# Patient Record
Sex: Female | Born: 1985
Health system: Southern US, Community
[De-identification: ages and names within clinical notes are randomized; demographics above are authoritative.]

## PROBLEM LIST (undated history)

## (undated) DIAGNOSIS — D259 Leiomyoma of uterus, unspecified: Secondary | ICD-10-CM

## (undated) DIAGNOSIS — Z9889 Other specified postprocedural states: Secondary | ICD-10-CM

## (undated) DIAGNOSIS — R112 Nausea with vomiting, unspecified: Secondary | ICD-10-CM

## (undated) DIAGNOSIS — N979 Female infertility, unspecified: Secondary | ICD-10-CM

## (undated) DIAGNOSIS — E282 Polycystic ovarian syndrome: Secondary | ICD-10-CM

## (undated) DIAGNOSIS — K219 Gastro-esophageal reflux disease without esophagitis: Secondary | ICD-10-CM

## (undated) DIAGNOSIS — I10 Essential (primary) hypertension: Secondary | ICD-10-CM

## (undated) DIAGNOSIS — Z8616 Personal history of COVID-19: Secondary | ICD-10-CM

## (undated) DIAGNOSIS — E890 Postprocedural hypothyroidism: Secondary | ICD-10-CM

## (undated) DIAGNOSIS — Z85850 Personal history of malignant neoplasm of thyroid: Secondary | ICD-10-CM

## (undated) HISTORY — PX: LAPAROSCOPIC GELPORT ASSISTED MYOMECTOMY: SHX6549

## (undated) HISTORY — PX: THYROID LOBECTOMY: SHX420

---

## 2005-10-26 ENCOUNTER — Other Ambulatory Visit: Admission: RE | Admit: 2005-10-26 | Discharge: 2005-10-26 | Payer: Self-pay | Admitting: Gynecology

## 2006-03-10 ENCOUNTER — Other Ambulatory Visit: Admission: RE | Admit: 2006-03-10 | Discharge: 2006-03-10 | Payer: Self-pay | Admitting: Gynecology

## 2008-05-01 ENCOUNTER — Encounter (INDEPENDENT_AMBULATORY_CARE_PROVIDER_SITE_OTHER): Payer: Self-pay | Admitting: Interventional Radiology

## 2008-05-01 ENCOUNTER — Other Ambulatory Visit: Admission: RE | Admit: 2008-05-01 | Discharge: 2008-05-01 | Payer: Self-pay | Admitting: Interventional Radiology

## 2008-05-01 ENCOUNTER — Encounter: Admission: RE | Admit: 2008-05-01 | Discharge: 2008-05-01 | Payer: Self-pay | Admitting: Otolaryngology

## 2008-06-19 ENCOUNTER — Ambulatory Visit (HOSPITAL_COMMUNITY): Admission: RE | Admit: 2008-06-19 | Discharge: 2008-06-20 | Payer: Self-pay | Admitting: Otolaryngology

## 2008-07-18 ENCOUNTER — Encounter (INDEPENDENT_AMBULATORY_CARE_PROVIDER_SITE_OTHER): Payer: Self-pay | Admitting: Otolaryngology

## 2008-07-18 ENCOUNTER — Inpatient Hospital Stay (HOSPITAL_COMMUNITY): Admission: RE | Admit: 2008-07-18 | Discharge: 2008-07-20 | Payer: Self-pay | Admitting: Otolaryngology

## 2010-01-29 IMAGING — US US BIOPSY
1 series · 14 of 14 positions shown · non-contrast
Comparison: Outside comparison ultrasound dated 04/04/2008

CLINICAL DATA: 2-year-old female palpable right thyroid mass

ULTRASOUND-GUIDED NEEDLE ASPIRATE BIOPSY, RIGHT LOBE OF THYROID
The above procedure was discussed with the patient and written
informed consent was obtained.

[Series 1: us biopsy · 0.08mm/px · 14 acquisitions, 14 frames shown]
[im 1/14]
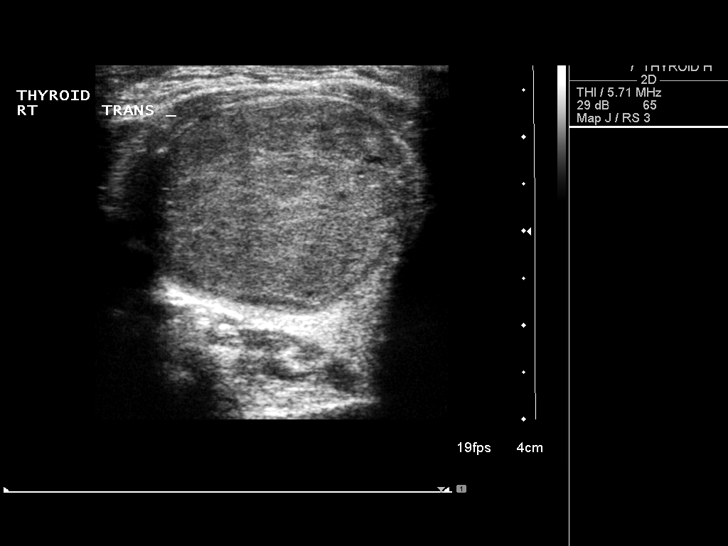
[im 2/14]
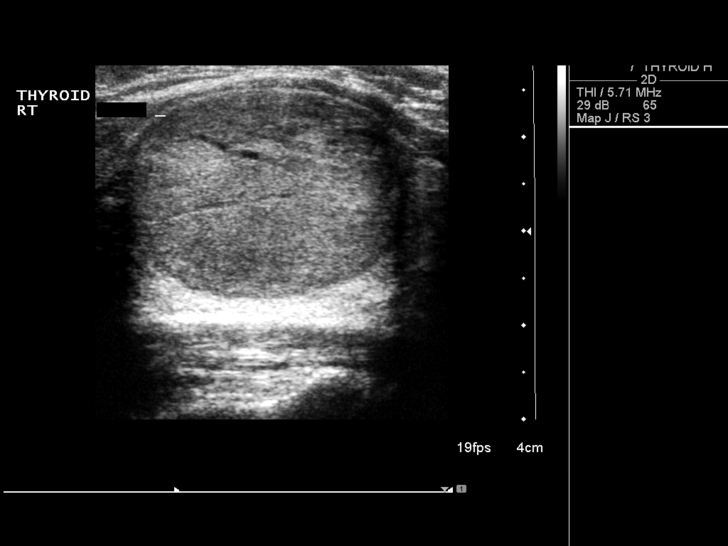
[im 3/14]
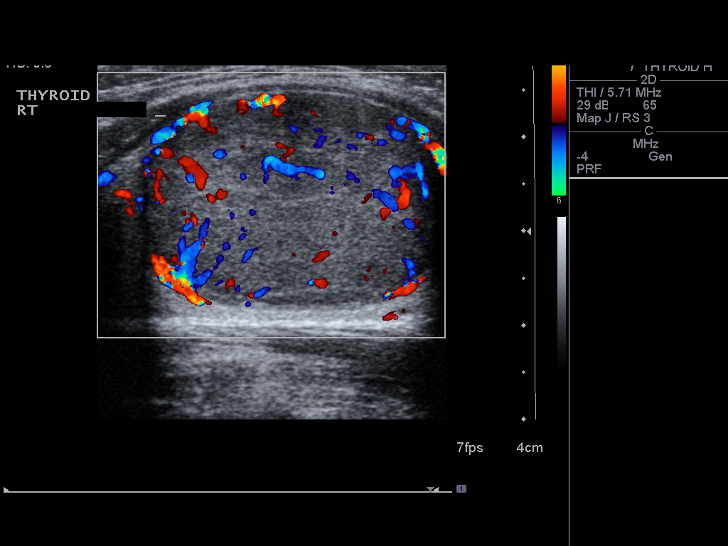
[im 4/14]
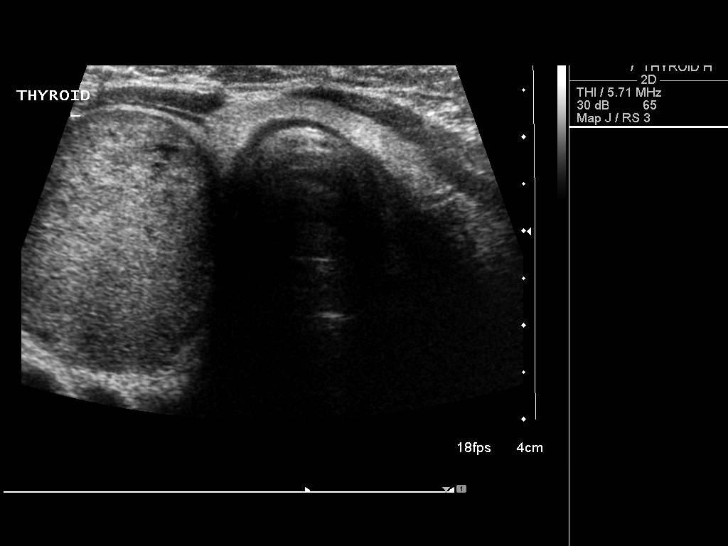
[im 5/14]
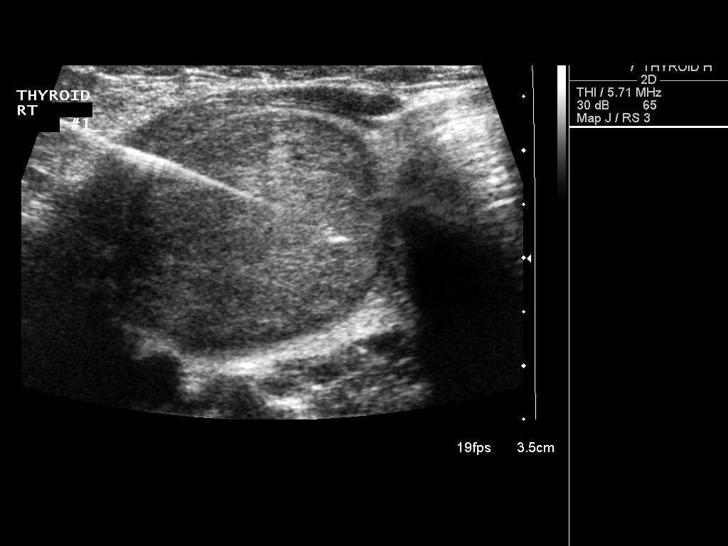
[im 6/14]
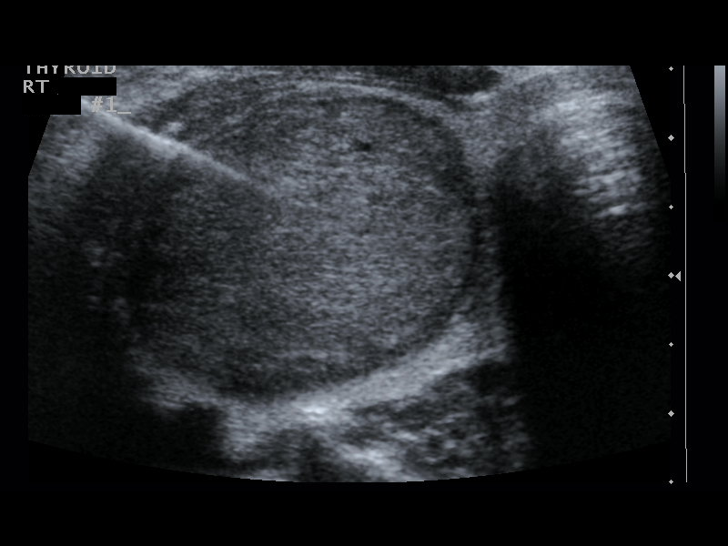
[im 7/14]
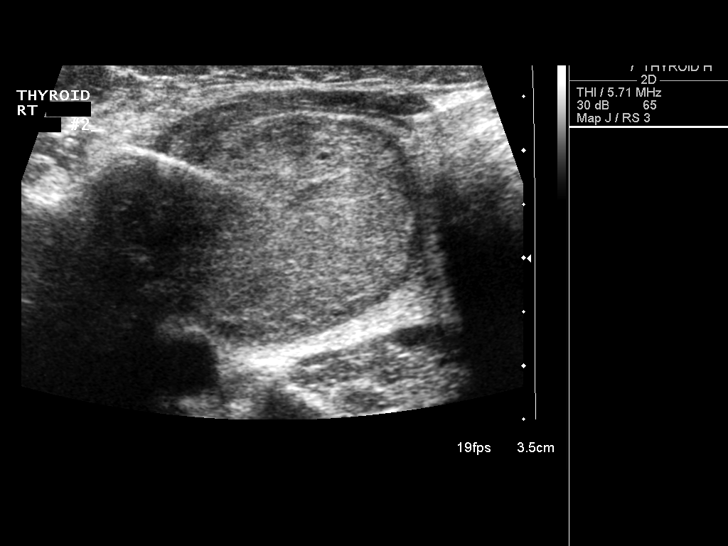
[im 8/14]
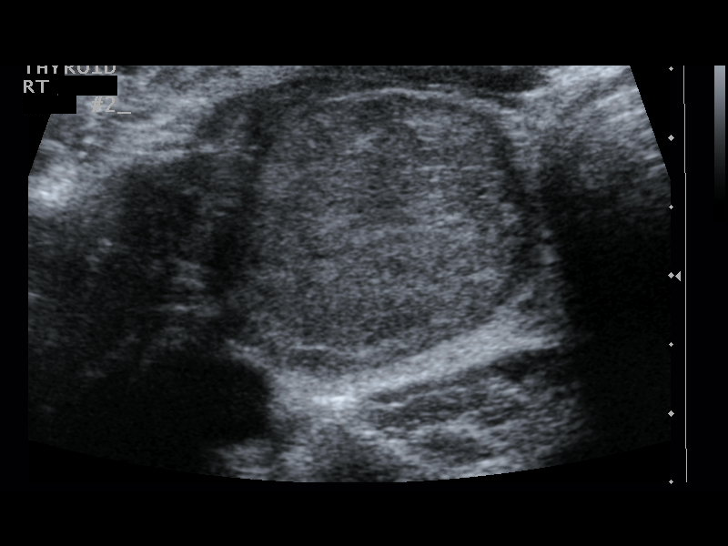
[im 9/14]
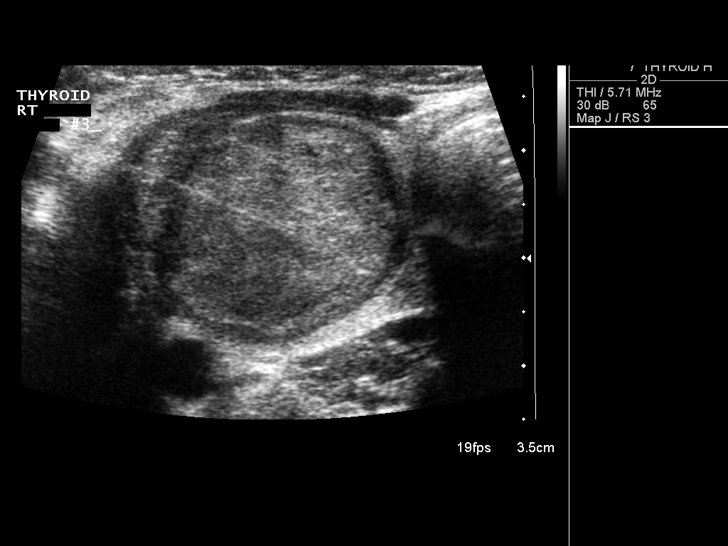
[im 10/14]
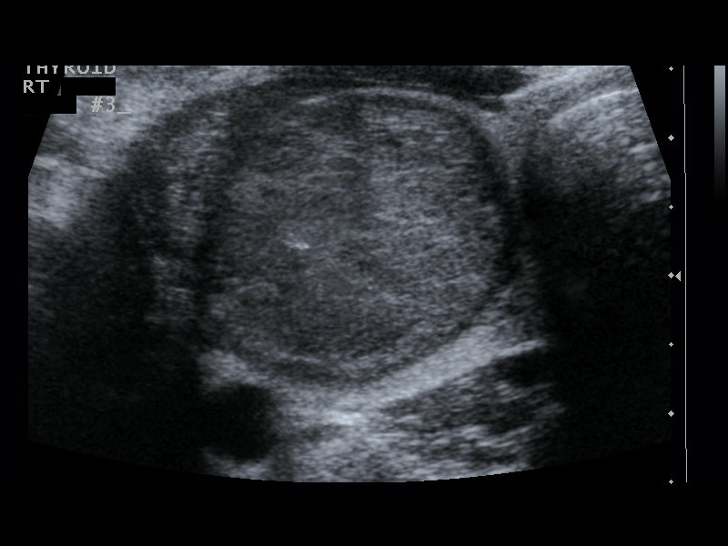
[im 11/14]
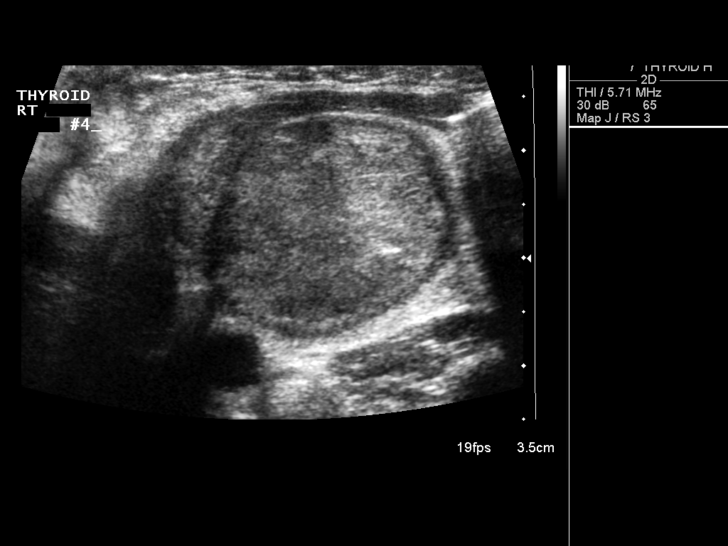
[im 12/14]
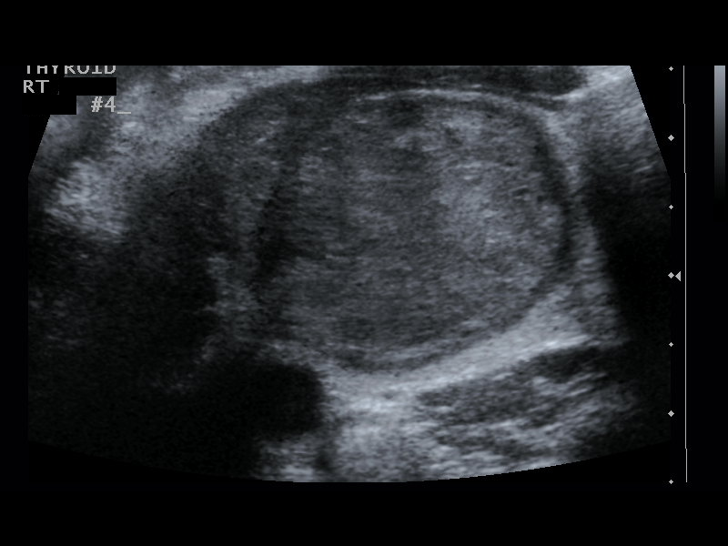
[im 13/14]
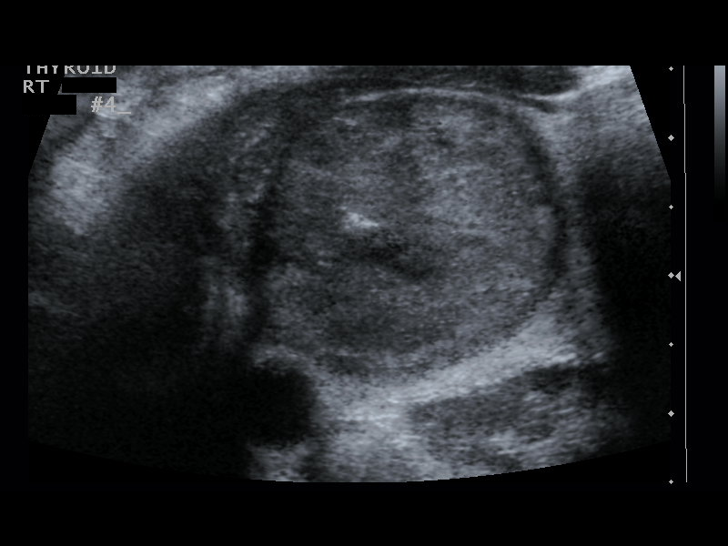
[im 14/14]
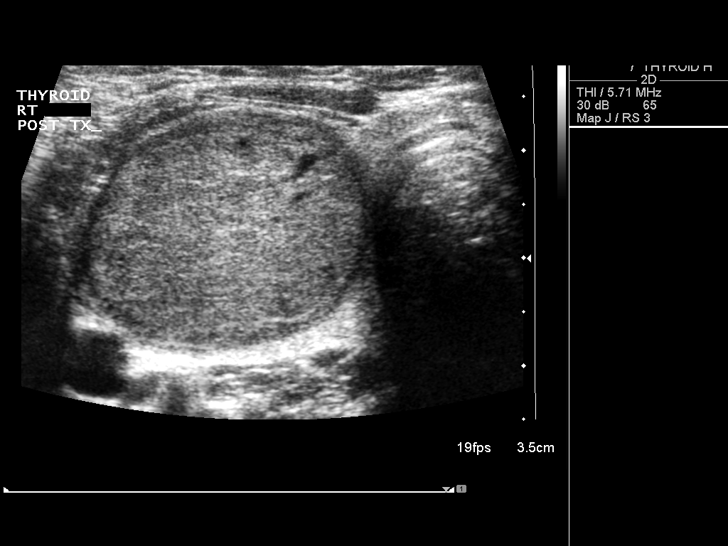

[14 of 14 positions shown; findings below may reference images not displayed]

FINDINGS: Ultrasound was performed to localize and mark an adequate
site for the biopsy.  The patient was then prepped and draped in a
normal sterile fashion.  Local anesthesia was provided with 1%
lidocaine.  Using direct ultrasound guidance, 4 passes were made
using 25 gauge needles into the nodule within the right lobe of the
thyroid.  Ultrasound was used to confirm needle placements on all
occasions.  Specimens were sent to Pathology for analysis.  Post
procedural imaging demonstrated no hematoma or immediate
complication.  The patient tolerated the procedure well.
IMPRESSION: Ultrasound guided right thyroid mass FNA biopsy (4).

## 2010-09-28 LAB — CBC
HCT: 40.1 % (ref 36.0–46.0)
Hemoglobin: 13.2 g/dL (ref 12.0–15.0)
MCHC: 32.8 g/dL (ref 30.0–36.0)
MCV: 92.9 fL (ref 78.0–100.0)
Platelets: 258 10*3/uL (ref 150–400)
RBC: 4.32 MIL/uL (ref 3.87–5.11)
RDW: 13.2 % (ref 11.5–15.5)
WBC: 5.3 10*3/uL (ref 4.0–10.5)

## 2010-09-29 LAB — CALCIUM
Calcium: 8.5 mg/dL (ref 8.4–10.5)
Calcium: 8.8 mg/dL (ref 8.4–10.5)
Calcium: 8.9 mg/dL (ref 8.4–10.5)
Calcium: 9.1 mg/dL (ref 8.4–10.5)

## 2010-09-29 LAB — CBC
HCT: 37.6 % (ref 36.0–46.0)
Hemoglobin: 13.1 g/dL (ref 12.0–15.0)
MCHC: 34.9 g/dL (ref 30.0–36.0)
MCV: 91.1 fL (ref 78.0–100.0)
Platelets: 236 10*3/uL (ref 150–400)
RBC: 4.13 MIL/uL (ref 3.87–5.11)
RDW: 12.9 % (ref 11.5–15.5)
WBC: 5.4 10*3/uL (ref 4.0–10.5)

## 2010-09-29 LAB — BASIC METABOLIC PANEL
BUN: 7 mg/dL (ref 6–23)
CO2: 28 mEq/L (ref 19–32)
Calcium: 9.4 mg/dL (ref 8.4–10.5)
Chloride: 105 mEq/L (ref 96–112)
Creatinine, Ser: 0.58 mg/dL (ref 0.4–1.2)
GFR calc Af Amer: >60 mL/min (ref >60)
GFR calc non Af Amer: >60 mL/min (ref >60)
Glucose, Bld: 94 mg/dL (ref 70–99)
Potassium: 3.9 mEq/L (ref 3.5–5.1)
Sodium: 139 mEq/L (ref 135–145)

## 2010-09-29 LAB — HCG, SERUM, QUALITATIVE: Preg, Serum: NEGATIVE

## 2010-10-27 NOTE — Op Note (Signed)
[name], [name]                ACCOUNT NO.:  [id number]   MEDICAL RECORD NO.:  [id number]          PATIENT TYPE:  OIB   LOCATION:  5121                         FACILITY:  [facility]   PHYSICIAN:  Kinnie Scales. Annalee Genta, M.D.DATE OF BIRTH:  12/26/1985   DATE OF PROCEDURE:  07/18/2008  DATE OF DISCHARGE:                               OPERATIVE REPORT   PREOPERATIVE DIAGNOSES:  1. 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  2. Status post right thyroid lobectomy (June 19, 2008).   POSTOPERATIVE DIAGNOSES:  1. 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  2. Status post right thyroid lobectomy (June 19, 2008).   INDICATIONS FOR SURGERY:  1. 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  2. Status post right thyroid lobectomy (June 19, 2008).   SURGICAL PROCEDURES:  Left completion thyroidectomy.   SURGEON:  Kinnie Scales. Annalee Genta, MD   ASSISTANT:  Karren Burly D. Jenne Pane, MD   ANESTHESIA:  General endotracheal with NIMS monitoring.   COMPLICATIONS:  None.   BLOOD LOSS:  Less than 50 mL.   The patient transferred from the operating room to recovery room in  stable condition.   BRIEF HISTORY:  The patient is a 25 year old white female who was  referred for evaluation of a right thyroid mass.  She had undergone  workup including fine-needle aspiration, which showed benign-appearing  thyroid cells.  Over time, the patient had a gradual enlargement in the  right thyroid lobe and given her history and physical examination, I  recommended thyroidectomy for removal and definitive pathologic  diagnosis.  The risks, benefits, and possible complications of the  procedure were discussed and the surgery was performed on June 19, 2008.  The final pathology from the resected tissue came back as a 3.6-  cm Hurthle cell carcinoma and given that history, I recommended that we  undertake completion thyroidectomy.  The procedure was scheduled on an  elective basis on July 18, 2008,  at [facility] Main OR.  Again, the risks, benefits, and possible complications of the procedure  were discussed in detail with the patient and her mother.  In  particular, discussion regarding long-term thyroid replacement,  subsequent therapy for thyroid carcinoma, possible hypocalcemia, and  recurrent laryngeal nerve injuries were discussed.  The patient and her  family understood concurred with our plan for surgery, which is  scheduled as above.   PROCEDURE:  The patient was brought to the operating room on July 18, 2008, and placed in supine position on the operating table.  General  endotracheal anesthesia was established without difficulty.  The  endotracheal tube was Xomed NIMS (nerve integrity monitor system tube)  for monitoring vocal cord function throughout the surgical procedure.  With the patient adequately anesthetized, she was injected with 2 mL of  1% lidocaine and 1:100,000 solution of epinephrine injected with  proposed skin incision.  She was then positioned on the operating table,  prepped and draped in a sterile fashion.  Using #15 scalpel, an incision  was created.  This carried through to the middle aspect of the patient's  previous neck incision and then extended several centimeters to the  left.  The skin and deep subcutaneous tissue were divided with a 15  scalpel and dissection was then carried out.  Subplatysmal flaps were  elevated inferiorly and superiorly.  The strap muscle curvature was  identified and divided in the midline and lateralized allowing access to  the anterior aspect of the neck.  There was a moderate amount of  scarring from the patient's previous surgical incision.  Midline was  identified.  Trachea was carefully dissected and this allowed access to  the left.  The strap muscles were elevated and the left thyroid lobe was  identified and careful dissection was carried out around the entire lobe  dissecting medially along the  thyroid capsule.  The recurrent laryngeal  nerve was identified inferiorly and traced superiorly preserving it  throughout its course.  The inferior parathyroid gland was identified  and preserved.  Middle thyroid vein was divided and suture ligated.  Dissection was then carried from inferior to superior.  Superior thyroid  pole was carefully dissected and the superior thyroid vessels were  identified, divided, and suture ligated.  The left thyroid lobe was then  carefully dissected away from the trachea.  There was moderate amount of  dense tracheal attachment, which was divided with blunt and sharp  dissection.  Again, the recurrent laryngeal nerve was preserved  throughout its course.  The left thyroid lobe was dissected and sent to  pathology for gross microscopic evaluation.  The patient's wound bed was  then thoroughly irrigated with saline solution.  A 7-French round drain  was placed in base of the wound and sutured to the skin with a 4-0  Ethilon suture.  The wound was then closed in multiple layers with  reapproximation of strap muscles in the midline with 3-0 Vicryl suture.  The platysma muscle was reapproximated with 4-0 Vicryl suture, deep  subcutaneous and superficial subcutaneous closure in interrupted fashion  with 4-0 and 5-0 Vicryl, and final skin edge closed with Dermabond.  The  patient was then awakened from anesthetic, extubated and transferred  from the operating room to the recovery room in stable condition.  No  complications.  Blood loss was approximately 50 mL.           ______________________________  Kinnie Scales. Annalee Genta, M.D.     DLS/MEDQ  D:  16/03/9603  T:  07/18/2008  Job:  [id number]

## 2010-10-27 NOTE — Op Note (Signed)
[name], [name]                ACCOUNT NO.:  [id number]   MEDICAL RECORD NO.:  [id number]          PATIENT TYPE:  OIB   LOCATION:  5123                         FACILITY:  [facility]   PHYSICIAN:  Kinnie Scales. Annalee Genta, M.D.DATE OF BIRTH:  04/09/1986   DATE OF PROCEDURE:  06/19/2008  DATE OF DISCHARGE:                               OPERATIVE REPORT   PREOPERATIVE DIAGNOSIS:  Right thyroid mass.   POSTOPERATIVE DIAGNOSIS:  Right thyroid mass.   INDICATIONS FOR SURGERY:  Right thyroid mass.   SURGICAL PROCEDURE:  Right hemithyroidectomy.   SURGEON:  Kinnie Scales. Annalee Genta, MD   ASSISTANT:  Gloris Manchester. Wolicki, MD   ANESTHESIA:  General endotracheal with nerve integrity monitoring  (NIMS).   ESTIMATED BLOOD LOSS:  Approximately 50 mL.   There were no complications.  The patient was transferred from the  operating room to recovery room in stable condition.   BRIEF HISTORY:  The patient is a 25 year old white female who was  referred for evaluation of an asymptomatic swelling in the right  inferior neck.  An ultrasound showed a nodular mass involving the right  thyroid lobe.  The remainder of the thyroid gland and neck were normal  on the ultrasound study.  Given the patient's history and examination,  ultrasound-guided fine aspiration was performed.  Pathology for the  resected tissue was consistent with a benign neoplasm.  Given the  patient's history and physical examination, I recommended that we  consider her for right hemithyroidectomy.  The risk, benefits, and  possible complications of procedure were discussed in detail.  The  patient and her family understood and concurred to the plan for surgery  which is scheduled on an elective basis at [facility] OR  on June 19, 2008.   PROCEDURE:  The patient was brought to the operating room on June 19, 2008 and placed in supine position on the operating table.  General  endotracheal anesthesia was established without  difficulty.  The  endotracheal tube was a Xomed NIMS tube for intraoperative recurrent  laryngeal nerve monitoring.  The NIMS device was checked prior to  beginning the surgical procedure and there was good signal bilaterally.  The patient was then positioned on the operating table and prepped and  draped in a sterile fashion.  She was injected with a total of 2 mL of  1% lidocaine 1:100,000 solution of epinephrine, injected in a  preexisting skin crease in the proposed skin incision in the anterior  lower aspect of the neck.  With the patient positioned, prepped and  draped, procedure was begun by creating a 4-cm horizontally oriented  incision with a #15 scalpel.  This was carried through the skin and  underlying subcutaneous tissue.  The dissection was carried through the  subcutaneous layer using Bovie electrocautery.  The anterior aspect of  the right platysma muscle was identified and divided.  Subplatysmal  flaps were elevated superiorly and inferiorly to allow access to the  anterior compartment of the neck.  The strap muscles were identified in  the midline and divided and then lateralized.  The anterior aspect of  the patient's trachea was then identified.  Dissection was carried  superiorly to expose the cricoid and thyroid cartilage.  The right  thyroid lobe was palpable with a significant enlargement compared to the  left.  Palpation of the left side showed no evidence of nodule or mass.  Dissection was then carried out along the right thyroid lobe using Bovie  electrocautery to elevate the strap muscles.  The right paratracheal  triangle was dissected using both blunt and sharp dissection.  The  common facial vein was identified, divided, and suture ligated.  The  right lobe of the gland was then reflected anteriorly, and dissection  was carried out superiorly and inferiorly.  Along the inferior pole, the  parathyroid gland was identified.  Vascular structures were ligated   using the Harmonic scalpel and inferior component of the thyroid gland  was then elevated.  The recurrent laryngeal nerve was identified and  then traced distally using the NIMS monitor throughout.  Dissection was  then carried along the superior pole of the thyroid gland.  The patient  had an approximately centimeter pyramidal lobe which emanated from the  right lobe of the thyroid gland.  This was also carefully dissected and  removed with the pathologic specimen.  Thyroid isthmus was divided.  The  superior pole of the thyroid was then carefully dissected, divided, and  suture ligated with 2-0 silk suture.  The recurrent laryngeal nerve was  traced to its insertion on the cricothyroid musculature.  The gland was  then elevated and the entire right thyroid lobe was resected and sent to  pathology for gross microscopic evaluation and frozen section analysis.  The thyroid lobe was consistent with a Hurthle cell neoplasm.  No  malignant features were identified, and final diagnosis was deferred to  final pathology.   With the right thyroid lobe dissected and removed, the recurrent  laryngeal nerve was inspected and stimulated at 0.5 millivolts with good  stimulation.  The patient's incision was then thoroughly irrigated.  A 7-  [place] round drain was placed at the depth of the right thyroid  dissection and sutured to the overlying skin with 4-0 Ethilon suture.  The incision was then closed in multiple layers beginning with  reapproximation of the strap muscles in the midline with 3-0 Vicryl  suture in an interrupted fashion.  The platysma muscle was  reapproximated with the same stitch.  Deep subcutaneous stitches were  used to approximate the next layer using 4-0 Vicryl suture.  Final skin  edge was closed with a running 6-0 Ethilon suture.  The wound was  dressed with bacitracin ointment.  The patient was awakened from  anesthetic, extubated, and was transferred to the operating room  to  recovery in stable condition.  No complications and blood loss was  approximately 50 mL.           ______________________________  Kinnie Scales. Annalee Genta, M.D.     DLS/MEDQ  D:  16/03/9603  T:  06/20/2008  Job:  [id number]

## 2019-03-15 HISTORY — PX: OVUM / OOCYTE RETRIEVAL: SUR1269

## 2019-10-04 ENCOUNTER — Encounter (HOSPITAL_COMMUNITY): Payer: Self-pay | Admitting: *Deleted

## 2019-10-04 ENCOUNTER — Other Ambulatory Visit: Payer: Self-pay

## 2019-10-04 NOTE — Progress Notes (Addendum)
ADDENDUM:  Chart reviewed by anesthesia, Konrad Felix PA, ok to proceed.   Spoke w/ via phone for pre-op interview--- PT Lab needs dos--  Istat 8, EKG, Urine preg         Lab results------ no COVID test ------ 10-06-2019 @ [id number] Arrive at ------- 1200 NPO after ------ MN w/ exception clear liquids until 0800 then nothing by mouth (no cream/ milk products) Medications to take morning of surgery ----- Labetalol, Synthroid, Oriahnn w/ sips of water Diabetic medication ----- n/a Patient Special Instructions ----- do not take metformin morning of surgery Pre-Op special Istructions ----- pre-op orders pending, sent dr Kerin Perna inbox message in epic Patient verbalized understanding of instructions that were given at this phone interview. Patient denies shortness of breath, chest pain, fever, cough a this phone interview.   Anesthesia Review: hx thyroid cancer s/p bilateral thyroidectomy and RAI 2010, no recurrance;  Hx HTN  PCP:  Maude Leriche PA Encrinologist: Dr Marcello Moores in [place] (pt stated lov 07/ 2020 televisit) Cardiologist : no Chest x-ray :  no EKG : no Stress test:  Pt stated never had one Echo : no Cardiac Cath :  no Sleep Study/ CPAP : NO Fasting Blood Sugar :      / Checks Blood Sugar -- times a day:   N/A Blood Thinner/ Instructions /Last Dose:  NO ASA / Instructions/ Last Dose :  NO

## 2019-10-06 ENCOUNTER — Other Ambulatory Visit (HOSPITAL_COMMUNITY)
Admission: RE | Admit: 2019-10-06 | Discharge: 2019-10-06 | Disposition: A | Discharge status: Home / Self Care | Payer: BC Managed Care – PPO | Source: Ambulatory Visit | Attending: Obstetrics and Gynecology | Admitting: Obstetrics and Gynecology

## 2019-10-06 DIAGNOSIS — Z20822 Contact with and (suspected) exposure to covid-19: Secondary | ICD-10-CM | POA: Diagnosis not present

## 2019-10-06 DIAGNOSIS — Z01812 Encounter for preprocedural laboratory examination: Secondary | ICD-10-CM | POA: Diagnosis present

## 2019-10-06 LAB — SARS CORONAVIRUS 2 (TAT 6-24 HRS): SARS Coronavirus 2: NEGATIVE

## 2019-10-10 ENCOUNTER — Encounter (HOSPITAL_BASED_OUTPATIENT_CLINIC_OR_DEPARTMENT_OTHER): Payer: Self-pay | Admitting: Obstetrics and Gynecology

## 2019-10-10 ENCOUNTER — Other Ambulatory Visit: Payer: Self-pay

## 2019-10-10 ENCOUNTER — Encounter (HOSPITAL_BASED_OUTPATIENT_CLINIC_OR_DEPARTMENT_OTHER)
Admission: RE | Disposition: A | Discharge status: Home / Self Care | Payer: Self-pay | Source: Home / Self Care | Attending: Obstetrics and Gynecology

## 2019-10-10 ENCOUNTER — Ambulatory Visit (HOSPITAL_BASED_OUTPATIENT_CLINIC_OR_DEPARTMENT_OTHER)
Admission: RE | Admit: 2019-10-10 | Discharge: 2019-10-10 | Disposition: A | Discharge status: Home / Self Care | Payer: BC Managed Care – PPO | Attending: Obstetrics and Gynecology | Admitting: Obstetrics and Gynecology

## 2019-10-10 ENCOUNTER — Ambulatory Visit (HOSPITAL_BASED_OUTPATIENT_CLINIC_OR_DEPARTMENT_OTHER): Payer: BC Managed Care – PPO | Admitting: Physician Assistant

## 2019-10-10 DIAGNOSIS — Z8616 Personal history of COVID-19: Secondary | ICD-10-CM | POA: Insufficient documentation

## 2019-10-10 DIAGNOSIS — Z79899 Other long term (current) drug therapy: Secondary | ICD-10-CM | POA: Diagnosis not present

## 2019-10-10 DIAGNOSIS — N978 Female infertility of other origin: Secondary | ICD-10-CM | POA: Insufficient documentation

## 2019-10-10 DIAGNOSIS — I10 Essential (primary) hypertension: Secondary | ICD-10-CM | POA: Diagnosis not present

## 2019-10-10 DIAGNOSIS — K219 Gastro-esophageal reflux disease without esophagitis: Secondary | ICD-10-CM | POA: Diagnosis not present

## 2019-10-10 DIAGNOSIS — Z85850 Personal history of malignant neoplasm of thyroid: Secondary | ICD-10-CM | POA: Insufficient documentation

## 2019-10-10 DIAGNOSIS — D250 Submucous leiomyoma of uterus: Secondary | ICD-10-CM | POA: Insufficient documentation

## 2019-10-10 DIAGNOSIS — Z7984 Long term (current) use of oral hypoglycemic drugs: Secondary | ICD-10-CM | POA: Insufficient documentation

## 2019-10-10 DIAGNOSIS — E282 Polycystic ovarian syndrome: Secondary | ICD-10-CM | POA: Diagnosis not present

## 2019-10-10 DIAGNOSIS — E890 Postprocedural hypothyroidism: Secondary | ICD-10-CM | POA: Insufficient documentation

## 2019-10-10 HISTORY — PX: HYSTEROSCOPY WITH RESECTOSCOPE: SHX5395

## 2019-10-10 HISTORY — DX: Leiomyoma of uterus, unspecified: D25.9

## 2019-10-10 HISTORY — DX: Nausea with vomiting, unspecified: R11.2

## 2019-10-10 HISTORY — DX: Personal history of COVID-19: Z86.16

## 2019-10-10 HISTORY — DX: Personal history of malignant neoplasm of thyroid: Z85.850

## 2019-10-10 HISTORY — DX: Polycystic ovarian syndrome: E28.2

## 2019-10-10 HISTORY — DX: Essential (primary) hypertension: I10

## 2019-10-10 HISTORY — DX: Postprocedural hypothyroidism: E89.0

## 2019-10-10 HISTORY — DX: Female infertility, unspecified: N97.9

## 2019-10-10 HISTORY — DX: Nausea with vomiting, unspecified: Z98.890

## 2019-10-10 HISTORY — DX: Gastro-esophageal reflux disease without esophagitis: K21.9

## 2019-10-10 LAB — POCT PREGNANCY, URINE: Preg Test, Ur: NEGATIVE

## 2019-10-10 LAB — POCT I-STAT, CHEM 8
BUN: 9 mg/dL (ref 6–20)
Calcium, Ion: 1.12 mmol/L — ABNORMAL LOW (ref 1.15–1.40)
Chloride: 104 mmol/L (ref 98–111)
Creatinine, Ser: 0.6 mg/dL (ref 0.44–1.00)
Glucose, Bld: 88 mg/dL (ref 70–99)
HCT: 34 % — ABNORMAL LOW (ref 36.0–46.0)
Hemoglobin: 11.6 g/dL — ABNORMAL LOW (ref 12.0–15.0)
Potassium: 5.4 mmol/L — ABNORMAL HIGH (ref 3.5–5.1)
Sodium: 137 mmol/L (ref 135–145)
TCO2: 30 mmol/L (ref 22–32)

## 2019-10-10 LAB — TYPE AND SCREEN
ABO/RH(D): O POS
Antibody Screen: NEGATIVE

## 2019-10-10 LAB — ABO/RH: ABO/RH(D): O POS

## 2019-10-10 SURGERY — HYSTEROSCOPY, USING RESECTOSCOPE
Anesthesia: General | Site: Vagina

## 2019-10-10 MED ORDER — PROPOFOL 10 MG/ML IV BOLUS
INTRAVENOUS | Status: DC | PRN
  Administered 2019-10-10: 200 mg via INTRAVENOUS

## 2019-10-10 MED ORDER — ACETAMINOPHEN-CODEINE #3 300-30 MG PO TABS: 1.0000 | ORAL_TABLET | ORAL | 0 refills | Status: AC | PRN

## 2019-10-10 MED ORDER — FENTANYL CITRATE (PF) 100 MCG/2ML IJ SOLN
25.0000 ug | INTRAMUSCULAR | Status: DC | PRN
  Administered 2019-10-10 (×2): 25 ug via INTRAVENOUS

## 2019-10-10 MED ORDER — KETOROLAC TROMETHAMINE 30 MG/ML IJ SOLN
INTRAMUSCULAR | Status: DC | PRN
Start: 2019-10-10 — End: 2019-10-10
  Administered 2019-10-10: 30 mg via INTRAVENOUS

## 2019-10-10 MED ORDER — VASOPRESSIN 20 UNIT/ML IV SOLN: INTRAVENOUS | Status: DC | PRN

## 2019-10-10 MED ORDER — DEXAMETHASONE SODIUM PHOSPHATE 10 MG/ML IJ SOLN
INTRAMUSCULAR | Status: AC
  Filled 2019-10-10: qty 1

## 2019-10-10 MED ORDER — FENTANYL CITRATE (PF) 100 MCG/2ML IJ SOLN
INTRAMUSCULAR | Status: AC
  Filled 2019-10-10: qty 2

## 2019-10-10 MED ORDER — MIDAZOLAM HCL 2 MG/2ML IJ SOLN
INTRAMUSCULAR | Status: AC
  Filled 2019-10-10: qty 2

## 2019-10-10 MED ORDER — ONDANSETRON HCL 4 MG/2ML IJ SOLN
INTRAMUSCULAR | Status: AC
  Filled 2019-10-10: qty 2

## 2019-10-10 MED ORDER — PROMETHAZINE HCL 25 MG/ML IJ SOLN
6.2500 mg | Freq: Once | INTRAMUSCULAR | Status: AC
  Administered 2019-10-10: 6.25 mg via INTRAVENOUS

## 2019-10-10 MED ORDER — SODIUM CHLORIDE 0.9 % IR SOLN
Status: DC | PRN
  Administered 2019-10-10: 6000 mL

## 2019-10-10 MED ORDER — ESTRADIOL 2 MG PO TABS: 3.0000 mg | ORAL_TABLET | Freq: Two times a day (BID) | ORAL | 0 refills | Status: DC

## 2019-10-10 MED ORDER — ONDANSETRON HCL 4 MG/2ML IJ SOLN
INTRAMUSCULAR | Status: DC | PRN
  Administered 2019-10-10: 4 mg via INTRAVENOUS

## 2019-10-10 MED ORDER — DEXAMETHASONE SODIUM PHOSPHATE 10 MG/ML IJ SOLN
INTRAMUSCULAR | Status: DC | PRN
  Administered 2019-10-10: 10 mg via INTRAVENOUS

## 2019-10-10 MED ORDER — VASOPRESSIN 20 UNIT/ML IV SOLN
INTRAVENOUS | Status: DC | PRN
  Administered 2019-10-10: 7 mL via INTRAMUSCULAR

## 2019-10-10 MED ORDER — CEFAZOLIN SODIUM-DEXTROSE 2-4 GM/100ML-% IV SOLN
2.0000 g | INTRAVENOUS | Status: AC
  Administered 2019-10-10: 14:00:00 2 g via INTRAVENOUS

## 2019-10-10 MED ORDER — FENTANYL CITRATE (PF) 100 MCG/2ML IJ SOLN
INTRAMUSCULAR | Status: DC | PRN
  Administered 2019-10-10 (×3): 25 ug via INTRAVENOUS
  Administered 2019-10-10 (×2): 50 ug via INTRAVENOUS
  Administered 2019-10-10: 25 ug via INTRAVENOUS

## 2019-10-10 MED ORDER — CEFAZOLIN SODIUM-DEXTROSE 2-4 GM/100ML-% IV SOLN
INTRAVENOUS | Status: AC
  Filled 2019-10-10: qty 100

## 2019-10-10 MED ORDER — PROMETHAZINE HCL 25 MG/ML IJ SOLN
INTRAMUSCULAR | Status: AC
  Filled 2019-10-10: qty 1

## 2019-10-10 MED ORDER — LACTATED RINGERS IV SOLN: INTRAVENOUS | Status: DC

## 2019-10-10 MED ORDER — MIDAZOLAM HCL 2 MG/2ML IJ SOLN
INTRAMUSCULAR | Status: DC | PRN
  Administered 2019-10-10: 2 mg via INTRAVENOUS

## 2019-10-10 MED ORDER — LIDOCAINE 2% (20 MG/ML) 5 ML SYRINGE
INTRAMUSCULAR | Status: DC | PRN
  Administered 2019-10-10: 100 mg via INTRAVENOUS

## 2019-10-10 MED ORDER — PROPOFOL 10 MG/ML IV BOLUS
INTRAVENOUS | Status: AC
  Filled 2019-10-10: qty 20

## 2019-10-10 MED ORDER — MEDROXYPROGESTERONE ACETATE 10 MG PO TABS
ORAL_TABLET | ORAL | 0 refills | Status: DC
Start: 2019-10-10 — End: 2020-08-05

## 2019-10-10 SURGICAL SUPPLY — 19 items
BIPOLAR CUTTING LOOP 21FR (ELECTRODE) ×1
BRR ADH 6X5 SEPRAFILM 1 SHT (MISCELLANEOUS) ×1
CATH ROBINSON RED A/P 16FR (CATHETERS) ×2 IMPLANT
COVER WAND RF STERILE (DRAPES) ×2 IMPLANT
ELECT BIPOLAR KNIFE NDL PTD 7M (ELECTRODE) IMPLANT
GAUZE 4X4 16PLY RFD (DISPOSABLE) ×2 IMPLANT
GLOVE BIO SURGEON STRL SZ 6.5 (GLOVE) ×1 IMPLANT
GLOVE BIO SURGEON STRL SZ8 (GLOVE) ×2 IMPLANT
GLOVE BIOGEL PI IND STRL 7.0 (GLOVE) IMPLANT
GLOVE BIOGEL PI INDICATOR 7.0 (GLOVE) ×2
GOWN STRL REUS W/TWL XL LVL3 (GOWN DISPOSABLE) ×4 IMPLANT
IV NS IRRIG 3000ML ARTHROMATIC (IV SOLUTION) ×3 IMPLANT
KIT PROCEDURE FLUENT (KITS) ×2 IMPLANT
LOOP CUTTING BIPOLAR 21FR (ELECTRODE) IMPLANT
PACK VAGINAL MINOR WOMEN LF (CUSTOM PROCEDURE TRAY) ×2 IMPLANT
PAD OB MATERNITY 4.3X12.25 (PERSONAL CARE ITEMS) ×2 IMPLANT
SEPRAFILM MEMBRANE 5X6 (MISCELLANEOUS) ×1 IMPLANT
SYRINGE 3CC/18X1.5 ECLIPSE (MISCELLANEOUS) ×2 IMPLANT
TOWEL OR 17X26 10 PK STRL BLUE (TOWEL DISPOSABLE) ×3 IMPLANT

## 2019-10-10 NOTE — Discharge Instructions (Signed)
Hysteroscopy, Care After This sheet gives you information about how to care for yourself after your procedure. Your health care provider may also give you more specific instructions. If you have problems or questions, contact your health care provider. What can I expect after the procedure? After the procedure, it is common to have:  Cramping.  Bleeding. This can vary from light spotting to menstrual-like bleeding. Follow these instructions at home: Activity  Rest for 1-2 days after the procedure.  Do not douche, use tampons, or have sex for 2 weeks after the procedure, or until your health care provider approves.  Do not drive for 24 hours after the procedure, or for as long as told by your health care provider.  Do not drive, use heavy machinery, or drink alcohol while taking prescription pain medicines. Medicines   Take over-the-counter and prescription medicines only as told by your health care provider.  Do not take aspirin during recovery. It can increase the risk of bleeding. General instructions  Do not take baths, swim, or use a hot tub until your health care provider approves. Take showers instead of baths for 2 weeks, or for as long as told by your health care provider.  To prevent or treat constipation while you are taking prescription pain medicine, your health care provider may recommend that you: ? Drink enough fluid to keep your urine clear or pale yellow. ? Take over-the-counter or prescription medicines. ? Eat foods that are high in fiber, such as fresh fruits and vegetables, whole grains, and beans. ? Limit foods that are high in fat and processed sugars, such as fried and sweet foods.  Keep all follow-up visits as told by your health care provider. This is important. Contact a health care provider if:  You feel dizzy or lightheaded.  You feel nauseous.  You have abnormal vaginal discharge.  You have a rash.  You have pain that does not get better with  medicine.  You have chills. Get help right away if:  You have bleeding that is heavier than a normal menstrual period.  You have a fever.  You have pain or cramps that get worse.  You develop new abdominal pain.  You faint.  You have pain in your shoulders.  You have shortness of breath. Summary  After the procedure, you may have cramping and some vaginal bleeding.  Do not douche, use tampons, or have sex for 2 weeks after the procedure, or until your health care provider approves.  Do not take baths, swim, or use a hot tub until your health care provider approves. Take showers instead of baths for 2 weeks, or for as long as told by your health care provider.  Report any unusual symptoms to your health care provider.  Keep all follow-up visits as told by your health care provider. This is important. This information is not intended to replace advice given to you by your health care provider. Make sure you discuss any questions you have with your health care provider. Document Revised: 05/13/2017 Document Reviewed: 06/29/2016 Elsevier Patient Education  [place] Instructions  Activity: Get plenty of rest for the remainder of the day. A responsible individual must stay with you for 24 hours following the procedure.  For the next 24 hours, DO NOT: -Drive a car -Paediatric nurse -Drink alcoholic beverages -Take any medication unless instructed by your physician -Make any legal decisions or sign important papers.  Meals: Start with liquid foods  such as gelatin or soup. Progress to regular foods as tolerated. Avoid greasy, spicy, heavy foods. If nausea and/or vomiting occur, drink only clear liquids until the nausea and/or vomiting subsides. Call your physician if vomiting continues.  Special Instructions/Symptoms: Your throat may feel dry or sore from the anesthesia or the breathing tube placed in your throat during surgery. If this  causes discomfort, gargle with warm salt water. The discomfort should disappear within 24 hours.  No ibuprofen, Motrin, Aleve, Advil, or naproxen until after 11:30pm tonight if needed.

## 2019-10-10 NOTE — Transfer of Care (Signed)
Immediate Anesthesia Transfer of Care Note  Patient: [name]  Procedure(s) Performed: Procedure(s) (LRB): HYSTEROSCOPY, BIPOLAR RESECTION of SUBMUCOSAL MYOMAS (N/A)  Patient Location: PACU  Anesthesia Type: General  Level of Consciousness: awake, oriented, sedated and patient cooperative  Airway & Oxygen Therapy: Patient Spontanous Breathing and Patient connected to face mask oxygen  Post-op Assessment: Report given to PACU RN and Post -op Vital signs reviewed and stable  Post vital signs: Reviewed and stable  Complications: No apparent anesthesia complications Last Vitals:  Vitals Value Taken Time  BP 161/96 10/10/19 1537  Temp 36.9 C 10/10/19 1537  Pulse 82 10/10/19 1540  Resp 12 10/10/19 1540  SpO2 100 % 10/10/19 1540  Vitals shown include unvalidated device data.  Last Pain:  Vitals:   10/10/19 1231  TempSrc: Oral  PainSc: 0-No pain      Patients Stated Pain Goal: 6 ([id number])  Complications:

## 2019-10-10 NOTE — Anesthesia Procedure Notes (Signed)
Procedure Name: LMA Insertion Date/Time: 10/10/2019 2:28 PM Performed by: Suan Halter, CRNA Pre-anesthesia Checklist: Patient identified, Emergency Drugs available, Suction available and Patient being monitored Patient Re-evaluated:Patient Re-evaluated prior to induction Oxygen Delivery Method: Circle system utilized Preoxygenation: Pre-oxygenation with 100% oxygen Induction Type: IV induction Ventilation: Mask ventilation without difficulty LMA: LMA inserted LMA Size: 4.0 Number of attempts: 1 Airway Equipment and Method: Bite block Placement Confirmation: positive ETCO2 Tube secured with: Tape Dental Injury: Teeth and Oropharynx as per pre-operative assessment

## 2019-10-10 NOTE — Anesthesia Postprocedure Evaluation (Signed)
Anesthesia Post Note  Patient: [name], molecular  Procedure(s) Performed: HYSTEROSCOPY, BIPOLAR RESECTION of SUBMUCOSAL MYOMAS (N/A Vagina )     Patient location during evaluation: PACU Anesthesia Type: General Level of consciousness: awake Pain management: pain level controlled Vital Signs Assessment: post-procedure vital signs reviewed and stable Respiratory status: spontaneous breathing Cardiovascular status: stable Postop Assessment: no apparent nausea or vomiting Anesthetic complications: no    Last Vitals:  Vitals:   10/10/19 1600 10/10/19 1615  BP: (!) 152/89 (!) 144/92  Pulse: 71 76  Resp: 14 12  Temp:    SpO2: 98% 96%    Last Pain:  Vitals:   10/10/19 1549  TempSrc:   PainSc: 7                  Beren Yniguez

## 2019-10-10 NOTE — Anesthesia Preprocedure Evaluation (Addendum)
Anesthesia Evaluation  Patient identified by MRN, date of birth, ID band Patient awake    Reviewed: Allergy & Precautions, NPO status , Patient's Chart, lab work & pertinent test results  History of Anesthesia Complications (+) PONV  Airway Mallampati: II  TM Distance: >3 FB     Dental   Pulmonary    breath sounds clear to auscultation       Cardiovascular hypertension,  Rhythm:Regular Rate:Normal     Neuro/Psych    GI/Hepatic Neg liver ROS, GERD  ,  Endo/Other  Hypothyroidism   Renal/GU negative Renal ROS     Musculoskeletal   Abdominal   Peds  Hematology   Anesthesia Other Findings   Reproductive/Obstetrics                             Anesthesia Physical Anesthesia Plan  ASA: III  Anesthesia Plan: General   Post-op Pain Management:    Induction: Intravenous  PONV Risk Score and Plan: 4 or greater and Ondansetron and Midazolam  Airway Management Planned: Oral ETT  Additional Equipment:   Intra-op Plan:   Post-operative Plan: Extubation in OR  Informed Consent: I have reviewed the patients History and Physical, chart, labs and discussed the procedure including the risks, benefits and alternatives for the proposed anesthesia with the patient or authorized representative who has indicated his/her understanding and acceptance.     Dental advisory given  Plan Discussed with: CRNA and Anesthesiologist  Anesthesia Plan Comments:         Anesthesia Quick Evaluation

## 2019-10-10 NOTE — Op Note (Signed)
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 The uterus sounded to 8 cm. Endometrial cavity had right type II submucosal myoma measuring 1.8 cm in the lower half of the uterine cavity as well as a left anterior 1.6 cm type I myoma in the lower uterine segment. Otherwise it was of normal appearance and normal configuration.  The fundus was slightly arcuate configuration. Both tubal ostia were seen.  Description of procedure: Patient was placed in dorsal supine position. General anesthesia was administered. She was placed in lithotomy position. She was prepped and draped in sterile manner. A vaginal speculum was placed. A dilute vasopressin solution containing 0.33 units per milliliter was injected into the cervical stroma x 5 cc. A Slimline hysteroscope with 12 lens was inserted into the canal and above findings were noted. Distention medium was normal saline. Distention method was a hysteroscopic pump set at 100 mm mercury (patient's MAP).  Above findings were noted. We then switched to a Slimline bipolar resectoscope with a 21 French loop electrode attached and resected the myomas carefully.  Intermittently the the uterine cavity was decompressed, allowing the intramural portion of the myomas to extrude into the cavity and the hysteroscopic resection was then resumed. Normal anatomy was achieved.  As an adhesion barrier, we rolled the large sheet of Seprafilm around DeBakey forceps and  inserted it until it touched the fundus and trimmed off.  Hemostasis was insured. Instrument count was correct. Estimated blood loss was less than 20 mL. The patient tolerated the procedure well and was transferred to recovery in satisfactory condition. Patient will take Estrace 3 mg twice daily for 30 days to expedite endometrial healing.  We will add Provera 10 mg to the last 5 days of this.  We will perform the frozen embryo transfer after confirming normal endometrial cavity healing.  Governor Specking, MD

## 2019-10-10 NOTE — H&P (Signed)
[name] is a 34 y.o. female , originally referred to me by Dr. Gunnar Bulla for infertility and myomectomy.  She was diagnosed with fibroids because of abnormal uterine bleeding and underwent a laparoscopy and GelPort myomectomy.  In vitro fertilization and frozen embryo transfer was performed without success.  At that time we noticed 2 new small submucosal myomas and a type II-type III myoma abutting the endometrium.  Transvaginal ultrasound showed right transmural myoma measuring 2.9x2.6cm, right type II submucosal myoma measuring 1.8cm, left anterior type two myoma measuring 1.9x1.3cm slightly distorting cavity. Patient would like to preserve her childbearing potential as well as conceive from her remaining cryopreserved embryos.  Pertinent Gynecological History: Menses: flow is excessive with use of 3 pads or tampons on heaviest days Bleeding: dysfunctional uterine bleeding Contraception: none DES exposure: denies Blood transfusions: none Sexually transmitted diseases: no past history Previous GYN Procedures: Laparoscopy GelPort assisted myomectomy Last pap: normal    Menstrual History: Menarche age: 49 No LMP recorded.    Past Medical History:  Diagnosis Date  . GERD (gastroesophageal reflux disease)    occasional , no meds   . History of 2019 novel coronavirus disease (COVID-19)    tested positive 04-12-2019 (results in care everywhere);  (10-04-2019 per pt symptoms where aches, head cold, fatigue, loss taste/ smell;  states symptoms resolved in 2 weeks)  . History of thyroid cancer (10-04-2019 per pt lov 07/ 2020, no recurrence   endocrinologist--- Dr Marcello Moores in [place]  (dx 01/ 2010)  s/p  thyroidectomy left 01/ 2010 and right 02/ 2010;  post RAI 03/ 2010    . Hypertension    followed by pcp   (10-04-2019 per pt never had a stress test)  . Hypothyroidism, postsurgical endocrinologist--- Dr Marcello Moores in [place]   s/p total thyroidectomy 2010 for cancer  . Infertility,  female   . PCOS (polycystic ovarian syndrome)   . PONV (postoperative nausea and vomiting)   . Uterine fibroid                     Past Surgical History:  Procedure Laterality Date  . LAPAROSCOPIC GELPORT ASSISTED MYOMECTOMY  05-01-2019   @[facility]   dr Marijo Conception ADHESIONS/ MYOMECTOMY/ EXCISION AND ABLATION ENDOMETRIOSIS/ CHROMOTUBATION  . OVUM / OOCYTE RETRIEVAL  03/2019  . THYROID LOBECTOMY Bilateral left 01/ 2010;  right 02/ 2010             History reviewed. No pertinent family history. No hereditary disease.  No cancer of breast, ovary, uterus. No cutaneous leiomyomatosis or renal cell carcinoma.  Social History   Socioeconomic History  . Marital status: Single    Spouse name: Not on file  . Number of children: Not on file  . Years of education: Not on file  . Highest education level: Not on file  Occupational History  . Not on file  Tobacco Use  . Smoking status: Never Smoker  . Smokeless tobacco: Never Used  Substance and Sexual Activity  . Alcohol use: Yes    Comment: occasional  . Drug use: Never  . Sexual activity: Not on file  Other Topics Concern  . Not on file  Social History Narrative  . Not on file   Social Determinants of Health   Financial Resource Strain:   . Difficulty of Paying Living Expenses:   Food Insecurity:   . Worried About Charity fundraiser in the Last Year:   . [place] in the Last  Year:   Transportation Needs:   . Film/video editor (Medical):   Marland Kitchen Lack of Transportation (Non-Medical):   Physical Activity:   . Days of Exercise per Week:   . Minutes of Exercise per Session:   Stress:   . Feeling of Stress :   Social Connections:   . Frequency of Communication with Friends and Family:   . Frequency of Social Gatherings with Friends and Family:   . Attends Religious Services:   . Active Member of Clubs or Organizations:   . Attends Archivist Meetings:   Marland Kitchen Marital Status:   Intimate Partner  Violence:   . Fear of Current or Ex-Partner:   . Emotionally Abused:   Marland Kitchen Physically Abused:   . Sexually Abused:     No Known Allergies  No current facility-administered medications on file prior to encounter.   Current Outpatient Medications on File Prior to Encounter  Medication Sig Dispense Refill  . Elagolix-Estrad-Noreth & Elago (ORIAHNN) 300-1-0.5 & 300 MG CPPK Take by mouth.    . labetalol (NORMODYNE) 100 MG tablet Take 100 mg by mouth 2 (two) times daily.    Marland Kitchen levothyroxine (SYNTHROID) 150 MCG tablet Take 150 mcg by mouth as directed. One tab daily in am with exception on Sunday's take half tab    . metFORMIN (GLUCOPHAGE) 1000 MG tablet Take 1,000 mg by mouth 2 (two) times daily with a meal.    . Prenatal Vit-Fe Fumarate-FA (MULTIVITAMIN-PRENATAL) 27-0.8 MG TABS tablet Take 1 tablet by mouth daily at 12 noon.       Review of Systems  Constitutional: Negative.   HENT: Negative.   Eyes: Negative.   Respiratory: Negative.   Cardiovascular: Negative.   Gastrointestinal: Negative.   Genitourinary: Negative.   Musculoskeletal: Negative.   Skin: Negative.   Neurological: Negative.   Endo/Heme/Allergies: Negative.   Psychiatric/Behavioral: Negative.      Physical Exam  BP (!) 165/81   Pulse 85   Temp 98.3 F (36.8 C) (Oral)   Resp 17   Ht 5\' 6"  (1.676 m)   Wt 79.8 kg   LMP 10/01/2019 (Exact Date)   SpO2 100%   BMI 28.39 kg/m  Constitutional: She is oriented to person, place, and time. She appears well-developed and well-nourished.  HENT:  Head: Normocephalic and atraumatic.  Nose: Nose normal.  Mouth/Throat: Oropharynx is clear and moist. No oropharyngeal exudate.  Eyes: Conjunctivae normal and EOM are normal. Pupils are equal, round, and reactive to light. No scleral icterus.  Neck: Normal range of motion. Neck supple. No tracheal deviation present. No thyromegaly present.  Cardiovascular: Normal rate.   Respiratory: Effort normal and breath sounds normal.   GI: Soft. Bowel sounds are normal. She exhibits no distension and no mass. There is no tenderness.  Lymphadenopathy:    She has no cervical adenopathy.  Neurological: She is alert and oriented to person, place, and time. She has normal reflexes.  Skin: Skin is warm.  Psychiatric: She has a normal mood and affect. Her behavior is normal. Judgment and thought content normal.   Assessment/Plan:  Recurrent submucosal and type II-type III myomas, status post laparoscopic GelPort assisted myomectomy Infertility, with failure of a single embryo transfer after in vitro fertilization Benefits and risks of the proposed procedure were discussed with the patient and her family member again.  Bowel prep instructions were given.  All of patient's questions were answered.  She verbalized understanding.  She knows that it is recommended she does not conceive  for 2-3 months for uterus to heal.   Governor Specking, MD

## 2019-10-11 LAB — SURGICAL PATHOLOGY

## 2020-02-11 LAB — OB RESULTS CONSOLE RPR: RPR: NONREACTIVE

## 2020-02-11 LAB — OB RESULTS CONSOLE ABO/RH: RH Type: POSITIVE

## 2020-02-11 LAB — OB RESULTS CONSOLE GC/CHLAMYDIA
Chlamydia: NEGATIVE
Gonorrhea: NEGATIVE

## 2020-02-11 LAB — OB RESULTS CONSOLE HIV ANTIBODY (ROUTINE TESTING): HIV: NONREACTIVE

## 2020-02-11 LAB — OB RESULTS CONSOLE ANTIBODY SCREEN: Antibody Screen: NEGATIVE

## 2020-02-11 LAB — OB RESULTS CONSOLE RUBELLA ANTIBODY, IGM: Rubella: IMMUNE

## 2020-02-11 LAB — OB RESULTS CONSOLE HEPATITIS B SURFACE ANTIGEN: Hepatitis B Surface Ag: NEGATIVE

## 2020-07-22 ENCOUNTER — Encounter (HOSPITAL_COMMUNITY): Payer: Self-pay

## 2020-07-22 ENCOUNTER — Encounter (HOSPITAL_COMMUNITY): Payer: Self-pay | Admitting: *Deleted

## 2020-07-22 NOTE — Patient Instructions (Signed)
[name]  07/22/2020   Your procedure is scheduled on:  07/31/2020  Arrive at [facility] [place] on [address] at [address]  and [address]. You are invited to use the FREE valet parking or use the Visitor's parking deck.  Pick up the phone at the desk and dial [phone].  Call this number if you have problems the morning of surgery: [phone]  Remember:   Do not eat food:(After Midnight) Desps de medianoche.  Do not drink clear liquids: (After Midnight) Desps de medianoche.  Take these medicines the morning of surgery with A SIP OF WATER:  Take synthroid and labetalol as prescribed   Do not wear jewelry, make-up or nail polish.  Do not wear lotions, powders, or perfumes. Do not wear deodorant.  Do not shave 48 hours prior to surgery.  Do not bring valuables to the hospital.  [facility] is not   responsible for any belongings or valuables brought to the hospital.  Contacts, dentures or bridgework may not be worn into surgery.  Leave suitcase in the car. After surgery it may be brought to your room.  For patients admitted to the hospital, checkout time is 11:00 AM the day of              discharge.      Please read over the following fact sheets that you were given:     Preparing for Surgery

## 2020-07-29 ENCOUNTER — Encounter (HOSPITAL_COMMUNITY)
Admission: RE | Admit: 2020-07-29 | Discharge: 2020-07-29 | Disposition: A | Discharge status: Home / Self Care | Payer: Managed Care, Other (non HMO) | Source: Ambulatory Visit | Attending: Obstetrics and Gynecology | Admitting: Obstetrics and Gynecology

## 2020-07-29 ENCOUNTER — Other Ambulatory Visit: Payer: Self-pay

## 2020-07-29 ENCOUNTER — Other Ambulatory Visit (HOSPITAL_COMMUNITY)
Admission: RE | Admit: 2020-07-29 | Discharge: 2020-07-29 | Disposition: A | Discharge status: Home / Self Care | Payer: Managed Care, Other (non HMO) | Source: Ambulatory Visit | Attending: Obstetrics and Gynecology | Admitting: Obstetrics and Gynecology

## 2020-07-29 DIAGNOSIS — Z20822 Contact with and (suspected) exposure to covid-19: Secondary | ICD-10-CM | POA: Insufficient documentation

## 2020-07-29 DIAGNOSIS — Z01812 Encounter for preprocedural laboratory examination: Secondary | ICD-10-CM | POA: Insufficient documentation

## 2020-07-29 LAB — COMPREHENSIVE METABOLIC PANEL
ALT: 20 U/L (ref 0–44)
AST: 25 U/L (ref 15–41)
Albumin: 2.7 g/dL — ABNORMAL LOW (ref 3.5–5.0)
Alkaline Phosphatase: 197 U/L — ABNORMAL HIGH (ref 38–126)
Anion gap: 10 (ref 5–15)
BUN: 7 mg/dL (ref 6–20)
CO2: 22 mmol/L (ref 22–32)
Calcium: 9.4 mg/dL (ref 8.9–10.3)
Chloride: 105 mmol/L (ref 98–111)
Creatinine, Ser: 0.71 mg/dL (ref 0.44–1.00)
GFR, Estimated: >60 mL/min (ref >60)
Glucose, Bld: 91 mg/dL (ref 70–99)
Potassium: 4.2 mmol/L (ref 3.5–5.1)
Sodium: 137 mmol/L (ref 135–145)
Total Bilirubin: 0.4 mg/dL (ref 0.3–1.2)
Total Protein: 5.8 g/dL — ABNORMAL LOW (ref 6.5–8.1)

## 2020-07-29 LAB — TYPE AND SCREEN
ABO/RH(D): O POS
Antibody Screen: NEGATIVE

## 2020-07-29 LAB — CBC
HCT: 38.6 % (ref 36.0–46.0)
Hemoglobin: 12.7 g/dL (ref 12.0–15.0)
MCH: 31.7 pg (ref 26.0–34.0)
MCHC: 32.9 g/dL (ref 30.0–36.0)
MCV: 96.3 fL (ref 80.0–100.0)
Platelets: 244 10*3/uL (ref 150–400)
RBC: 4.01 MIL/uL (ref 3.87–5.11)
RDW: 13.8 % (ref 11.5–15.5)
WBC: 7.6 10*3/uL (ref 4.0–10.5)
nRBC: 0 % (ref 0.0–0.2)

## 2020-07-29 LAB — SARS CORONAVIRUS 2 (TAT 6-24 HRS): SARS Coronavirus 2: NEGATIVE

## 2020-07-30 LAB — RPR: RPR Ser Ql: NONREACTIVE

## 2020-07-30 NOTE — Anesthesia Preprocedure Evaluation (Addendum)
Anesthesia Evaluation  Patient identified by MRN, date of birth, ID band Patient awake    Reviewed: Allergy & Precautions, NPO status , Patient's Chart, lab work & pertinent test results  History of Anesthesia Complications (+) PONV and history of anesthetic complications  Airway Mallampati: II  TM Distance: >3 FB Neck ROM: Full    Dental no notable dental hx.    Pulmonary neg pulmonary ROS,    Pulmonary exam normal breath sounds clear to auscultation       Cardiovascular hypertension, Pt. on medications and Pt. on home beta blockers Normal cardiovascular exam Rhythm:Regular Rate:Normal  ECG: NSR, rate 77   Neuro/Psych negative neurological ROS  negative psych ROS   GI/Hepatic negative GI ROS, Neg liver ROS,   Endo/Other  Hypothyroidism PCOS (polycystic ovarian syndrome)  Renal/GU negative Renal ROS     Musculoskeletal negative musculoskeletal ROS (+)   Abdominal (+) + obese,   Peds  Hematology negative hematology ROS (+)   Anesthesia Other Findings 37wks Twins, history of myomectomy  Reproductive/Obstetrics (+) Pregnancy                            Anesthesia Physical Anesthesia Plan  ASA: II  Anesthesia Plan: Spinal   Post-op Pain Management:    Induction:   PONV Risk Score and Plan: 3 and Ondansetron, Dexamethasone and Treatment may vary due to age or medical condition  Airway Management Planned: Natural Airway  Additional Equipment:   Intra-op Plan:   Post-operative Plan:   Informed Consent: I have reviewed the patients History and Physical, chart, labs and discussed the procedure including the risks, benefits and alternatives for the proposed anesthesia with the patient or authorized representative who has indicated his/her understanding and acceptance.     Dental advisory given  Plan Discussed with: CRNA  Anesthesia Plan Comments:        Anesthesia Quick  Evaluation

## 2020-07-30 NOTE — H&P (Signed)
[name] is a 35 y.o. female G1P0 at 46 70/7 weeks (EDD 08/21/20 by known DOC with IVF pregnancy with donor sperm)  presenting for scheduled c-section with twin gestation and h/o myomectomy necessitating delivery by c-section at 37 weeks. Prenatal care significant for:  1)Hypothyroidism  Stable on 167mcg daily--[place] endocrine Dr. Marcello Moores Meds adjusted 06/06/20--repeat labs mid Jan with TSH slightly low, free T4 WNL--per endocrine continue same dose and repeat with them pp   2)CHTN Labetalol 100mg  po BID since beginning IVF process and stable Baby ASA q day baseline prot:creatinine ratio: 93mg    3) Prior myomectomy  Requires c-section for delivery at 71 weeks   4) Advanced maternal age gravida  NIPT low risk x 2   5) In vitro fertilization  Donor sperm IVF using her eggs --not tested preimplantation NST's at 28 weeks   6) Dichorionic diamniotic twin pregnancy  Donor sperm IVF using her eggs --[place] mom, not tested preimplantation Growth [place] q 4 weeks concordant growth Last [place] 07/17/20 Twin A vtx and 66%ile, Twin B breech and 61%ile. (6lb5oz and 6#2oz) Normal AFI x 2     OB History    Gravida  1   Para      Term      Preterm      AB      Living        SAB      IAB      Ectopic      Multiple      Live Births             Past Medical History:  Diagnosis Date  . GERD (gastroesophageal reflux disease)    occasional , no meds   . History of 2019 novel coronavirus disease (COVID-19)    tested positive 04-12-2019 (results in care everywhere);  (10-04-2019 per pt symptoms where aches, head cold, fatigue, loss taste/ smell;  states symptoms resolved in 2 weeks)  . History of thyroid cancer (10-04-2019 per pt lov 07/ 2020, no recurrence   endocrinologist--- Dr Marcello Moores in [place]  (dx 01/ 2010)  s/p  thyroidectomy left 01/ 2010 and right 02/ 2010;  post RAI 03/ 2010    . Hypertension    followed by pcp   (10-04-2019 per pt never had a stress test)  .  Hypothyroidism, postsurgical endocrinologist--- Dr Marcello Moores in [place]   s/p total thyroidectomy 2010 for cancer  . Infertility, female   . PCOS (polycystic ovarian syndrome)   . PONV (postoperative nausea and vomiting)   . Uterine fibroid    Past Surgical History:  Procedure Laterality Date  . HYSTEROSCOPY WITH RESECTOSCOPE N/A 10/10/2019   Procedure: HYSTEROSCOPY, BIPOLAR RESECTION of SUBMUCOSAL MYOMAS;  Surgeon: Governor Specking, MD;  Location: [facility];  Service: Gynecology;  Laterality: N/A;  . LAPAROSCOPIC GELPORT ASSISTED MYOMECTOMY  05-01-2019   @[facility]   dr Marijo Conception ADHESIONS/ MYOMECTOMY/ EXCISION AND ABLATION ENDOMETRIOSIS/ CHROMOTUBATION  . OVUM / OOCYTE RETRIEVAL  03/2019  . THYROID LOBECTOMY Bilateral left 01/ 2010;  right 02/ 2010   Family History: family history includes Diabetes in her father; Heart disease in her father; Hypertension in her father. Social History:  reports that she has never smoked. She has never used smokeless tobacco. She reports current alcohol use. She reports that she does not use drugs.     Maternal Diabetes: No Genetic Screening: Normal Maternal Ultrasounds/Referrals: Normal Fetal Ultrasounds or other Referrals:  None Maternal Substance Abuse:  No Significant Maternal Medications:  Meds include: Syntroid Significant Maternal Lab Results:  Group B Strep negative Other Comments:  None  Review of Systems  Constitutional: Negative for fever.  Gastrointestinal: Negative for abdominal pain.  Genitourinary: Negative for pelvic pain.   Maternal Medical History:  Contractions: Frequency: irregular.   Perceived severity is mild.    Fetal activity: Perceived fetal activity is normal.    Prenatal complications: PIH.   Di/di twin gestation, CHTN, AMA, Hypothyroidism, IVF  Prenatal Complications - Diabetes: none.      Last menstrual period 10/01/2019. Maternal Exam:  Uterine Assessment: Contraction strength is mild.   Contraction frequency is irregular.   Abdomen: Patient reports no abdominal tenderness. Introitus: Normal vulva. Normal vagina.    Physical Exam Cardiovascular:     Rate and Rhythm: Normal rate and regular rhythm.  Pulmonary:     Effort: Pulmonary effort is normal.     Breath sounds: Normal breath sounds.  Abdominal:     Palpations: Abdomen is soft.     Comments: Gravid with fundal height at 43cm  Genitourinary:    General: Normal vulva.  Neurological:     General: No focal deficit present.     Mental Status: She is alert.  Psychiatric:        Mood and Affect: Mood normal.     Prenatal labs: ABO, Rh: --/--/O POS (02/15 4970) Antibody: NEG (02/15 2637) Rubella: Immune (08/30 0000) RPR: Nonreactive (08/30 0000)  HBsAg: Negative (08/30 0000)  HIV: Non-reactive (08/30 0000)  GBS:   Negative Glucola 118 NIPT low risk x 2  Assessment/Plan: Patient  was counseled regarding the risks and benefits of C-section and the procedure was reviewed in detail.  Risks of bleeding, infection, and possible damage to bowel and bladder were reviewed,  The patient would accept a blood transfusion if needed.  She desires to proceed.   Logan Bores 07/30/2020, 8:57 AM

## 2020-07-31 ENCOUNTER — Other Ambulatory Visit: Payer: Self-pay

## 2020-07-31 ENCOUNTER — Inpatient Hospital Stay (HOSPITAL_COMMUNITY): Payer: Managed Care, Other (non HMO) | Admitting: Anesthesiology

## 2020-07-31 ENCOUNTER — Encounter (HOSPITAL_COMMUNITY): Payer: Self-pay | Admitting: Obstetrics and Gynecology

## 2020-07-31 ENCOUNTER — Inpatient Hospital Stay (HOSPITAL_COMMUNITY)
Admission: RE | Admit: 2020-07-31 | Discharge: 2020-08-05 | DRG: 787 | Disposition: A | Discharge status: Home / Self Care | Payer: Managed Care, Other (non HMO) | Attending: Obstetrics and Gynecology | Admitting: Obstetrics and Gynecology

## 2020-07-31 ENCOUNTER — Encounter (HOSPITAL_COMMUNITY)
Admission: RE | Disposition: A | Discharge status: Home / Self Care | Payer: Self-pay | Source: Home / Self Care | Attending: Obstetrics and Gynecology

## 2020-07-31 DIAGNOSIS — O114 Pre-existing hypertension with pre-eclampsia, complicating childbirth: Secondary | ICD-10-CM | POA: Diagnosis present

## 2020-07-31 DIAGNOSIS — O3429 Maternal care due to uterine scar from other previous surgery: Secondary | ICD-10-CM | POA: Diagnosis present

## 2020-07-31 DIAGNOSIS — O30043 Twin pregnancy, dichorionic/diamniotic, third trimester: Principal | ICD-10-CM | POA: Diagnosis present

## 2020-07-31 DIAGNOSIS — O328XX2 Maternal care for other malpresentation of fetus, fetus 2: Secondary | ICD-10-CM | POA: Diagnosis present

## 2020-07-31 DIAGNOSIS — E039 Hypothyroidism, unspecified: Secondary | ICD-10-CM | POA: Diagnosis present

## 2020-07-31 DIAGNOSIS — O99284 Endocrine, nutritional and metabolic diseases complicating childbirth: Secondary | ICD-10-CM | POA: Diagnosis present

## 2020-07-31 DIAGNOSIS — Z8616 Personal history of COVID-19: Secondary | ICD-10-CM | POA: Diagnosis not present

## 2020-07-31 DIAGNOSIS — O1002 Pre-existing essential hypertension complicating childbirth: Secondary | ICD-10-CM | POA: Diagnosis present

## 2020-07-31 DIAGNOSIS — Z3A37 37 weeks gestation of pregnancy: Secondary | ICD-10-CM

## 2020-07-31 DIAGNOSIS — Z98891 History of uterine scar from previous surgery: Secondary | ICD-10-CM

## 2020-07-31 SURGERY — Surgical Case
Anesthesia: Spinal

## 2020-07-31 MED ORDER — NALBUPHINE HCL 10 MG/ML IJ SOLN: 5.0000 mg | INTRAMUSCULAR | Status: DC | PRN

## 2020-07-31 MED ORDER — ACETAMINOPHEN 10 MG/ML IV SOLN
INTRAVENOUS | Status: AC
  Filled 2020-07-31: qty 100

## 2020-07-31 MED ORDER — LACTATED RINGERS IV SOLN: INTRAVENOUS | Status: DC

## 2020-07-31 MED ORDER — LABETALOL HCL 100 MG PO TABS
100.0000 mg | ORAL_TABLET | Freq: Two times a day (BID) | ORAL | Status: DC
Start: 2020-07-31 — End: 2020-08-02
  Administered 2020-07-31 – 2020-08-02 (×4): 100 mg via ORAL
  Filled 2020-07-31 (×4): qty 1

## 2020-07-31 MED ORDER — MORPHINE SULFATE (PF) 0.5 MG/ML IJ SOLN
INTRAMUSCULAR | Status: AC
  Filled 2020-07-31: qty 10

## 2020-07-31 MED ORDER — KETOROLAC TROMETHAMINE 30 MG/ML IJ SOLN: 30.0000 mg | Freq: Four times a day (QID) | INTRAMUSCULAR | Status: DC

## 2020-07-31 MED ORDER — NALOXONE HCL 4 MG/10ML IJ SOLN
1.0000 ug/kg/h | INTRAVENOUS | Status: DC | PRN
  Filled 2020-07-31: qty 5

## 2020-07-31 MED ORDER — KETOROLAC TROMETHAMINE 30 MG/ML IJ SOLN
30.0000 mg | Freq: Four times a day (QID) | INTRAMUSCULAR | Status: AC
  Administered 2020-07-31 – 2020-08-01 (×3): 30 mg via INTRAVENOUS
  Filled 2020-07-31 (×3): qty 1

## 2020-07-31 MED ORDER — NALBUPHINE HCL 10 MG/ML IJ SOLN
5.0000 mg | Freq: Once | INTRAMUSCULAR | Status: DC | PRN
Start: 2020-07-31 — End: 2020-08-05

## 2020-07-31 MED ORDER — SCOPOLAMINE 1 MG/3DAYS TD PT72
1.0000 | MEDICATED_PATCH | Freq: Once | TRANSDERMAL | Status: AC
  Administered 2020-07-31: 1.5 mg via TRANSDERMAL
  Filled 2020-07-31 (×2): qty 1

## 2020-07-31 MED ORDER — MENTHOL 3 MG MT LOZG: 1.0000 | LOZENGE | OROMUCOSAL | Status: DC | PRN

## 2020-07-31 MED ORDER — ONDANSETRON HCL 4 MG/2ML IJ SOLN
4.0000 mg | Freq: Three times a day (TID) | INTRAMUSCULAR | Status: DC | PRN
  Administered 2020-07-31 (×2): 4 mg via INTRAVENOUS
  Filled 2020-07-31: qty 2

## 2020-07-31 MED ORDER — SODIUM CHLORIDE 0.9 % IR SOLN
Status: DC | PRN
  Administered 2020-07-31: 1

## 2020-07-31 MED ORDER — STERILE WATER FOR IRRIGATION IR SOLN
Status: DC | PRN
  Administered 2020-07-31: 1000 mL

## 2020-07-31 MED ORDER — AMISULPRIDE (ANTIEMETIC) 5 MG/2ML IV SOLN: 10.0000 mg | Freq: Once | INTRAVENOUS | Status: DC | PRN

## 2020-07-31 MED ORDER — DIPHENHYDRAMINE HCL 25 MG PO CAPS
25.0000 mg | ORAL_CAPSULE | Freq: Four times a day (QID) | ORAL | Status: DC | PRN
Start: 2020-07-31 — End: 2020-08-05

## 2020-07-31 MED ORDER — SIMETHICONE 80 MG PO CHEW
80.0000 mg | CHEWABLE_TABLET | Freq: Three times a day (TID) | ORAL | Status: DC
  Administered 2020-07-31 – 2020-08-05 (×12): 80 mg via ORAL
  Filled 2020-07-31 (×13): qty 1

## 2020-07-31 MED ORDER — PRENATAL MULTIVITAMIN CH
1.0000 | ORAL_TABLET | Freq: Every day | ORAL | Status: DC
  Administered 2020-08-01 – 2020-08-04 (×4): 1 via ORAL
  Filled 2020-07-31 (×5): qty 1

## 2020-07-31 MED ORDER — DIPHENHYDRAMINE HCL 25 MG PO CAPS: 25.0000 mg | ORAL_CAPSULE | ORAL | Status: DC | PRN

## 2020-07-31 MED ORDER — LACTATED RINGERS IV BOLUS
1000.0000 mL | Freq: Once | INTRAVENOUS | Status: AC
  Administered 2020-07-31: 1000 mL via INTRAVENOUS

## 2020-07-31 MED ORDER — KETOROLAC TROMETHAMINE 30 MG/ML IJ SOLN
INTRAMUSCULAR | Status: AC
  Filled 2020-07-31: qty 1

## 2020-07-31 MED ORDER — PHENYLEPHRINE HCL-NACL 20-0.9 MG/250ML-% IV SOLN
INTRAVENOUS | Status: DC | PRN
  Administered 2020-07-31: 60 ug/min via INTRAVENOUS

## 2020-07-31 MED ORDER — LEVOTHYROXINE SODIUM 25 MCG PO TABS
87.5000 ug | ORAL_TABLET | ORAL | Status: DC
  Administered 2020-08-03: 87.5 ug via ORAL
  Filled 2020-07-31: qty 4
  Filled 2020-07-31 (×2): qty 1

## 2020-07-31 MED ORDER — COCONUT OIL OIL: 1.0000 "application " | TOPICAL_OIL | Status: DC | PRN

## 2020-07-31 MED ORDER — ACETAMINOPHEN 10 MG/ML IV SOLN
1000.0000 mg | Freq: Once | INTRAVENOUS | Status: DC | PRN
Start: 2020-07-31 — End: 2020-07-31
  Administered 2020-07-31: 1000 mg via INTRAVENOUS

## 2020-07-31 MED ORDER — OXYCODONE HCL 5 MG/5ML PO SOLN: 5.0000 mg | Freq: Once | ORAL | Status: DC | PRN

## 2020-07-31 MED ORDER — DIPHENHYDRAMINE HCL 50 MG/ML IJ SOLN: 12.5000 mg | Freq: Four times a day (QID) | INTRAMUSCULAR | Status: DC | PRN

## 2020-07-31 MED ORDER — CEFAZOLIN SODIUM-DEXTROSE 2-4 GM/100ML-% IV SOLN: 2.0000 g | INTRAVENOUS | Status: DC

## 2020-07-31 MED ORDER — POVIDONE-IODINE 10 % EX SWAB
2.0000 "application " | Freq: Once | CUTANEOUS | Status: AC
  Administered 2020-07-31: 2 via TOPICAL

## 2020-07-31 MED ORDER — FAMOTIDINE IN NACL 20-0.9 MG/50ML-% IV SOLN
20.0000 mg | Freq: Once | INTRAVENOUS | Status: AC
  Administered 2020-07-31: 20 mg via INTRAVENOUS
  Filled 2020-07-31 (×2): qty 50

## 2020-07-31 MED ORDER — OXYTOCIN-SODIUM CHLORIDE 30-0.9 UT/500ML-% IV SOLN
INTRAVENOUS | Status: AC
  Filled 2020-07-31: qty 500

## 2020-07-31 MED ORDER — LEVOTHYROXINE SODIUM 25 MCG PO TABS
175.0000 ug | ORAL_TABLET | ORAL | Status: DC
  Administered 2020-08-01 – 2020-08-05 (×4): 175 ug via ORAL
  Filled 2020-07-31: qty 1
  Filled 2020-07-31: qty 7
  Filled 2020-07-31: qty 1

## 2020-07-31 MED ORDER — SIMETHICONE 80 MG PO CHEW: 80.0000 mg | CHEWABLE_TABLET | ORAL | Status: DC | PRN

## 2020-07-31 MED ORDER — MEPERIDINE HCL 25 MG/ML IJ SOLN: 6.2500 mg | INTRAMUSCULAR | Status: DC | PRN

## 2020-07-31 MED ORDER — LEVOTHYROXINE SODIUM 175 MCG PO TABS: 87.5000 ug | ORAL_TABLET | ORAL | Status: DC

## 2020-07-31 MED ORDER — IBUPROFEN 800 MG PO TABS
800.0000 mg | ORAL_TABLET | Freq: Three times a day (TID) | ORAL | Status: AC
  Administered 2020-08-01 – 2020-08-03 (×8): 800 mg via ORAL
  Filled 2020-07-31 (×8): qty 1

## 2020-07-31 MED ORDER — NALOXONE HCL 0.4 MG/ML IJ SOLN: 0.4000 mg | INTRAMUSCULAR | Status: DC | PRN

## 2020-07-31 MED ORDER — KETOROLAC TROMETHAMINE 30 MG/ML IJ SOLN
30.0000 mg | Freq: Once | INTRAMUSCULAR | Status: AC
  Administered 2020-07-31: 30 mg via INTRAVENOUS

## 2020-07-31 MED ORDER — FENTANYL CITRATE (PF) 100 MCG/2ML IJ SOLN
INTRAMUSCULAR | Status: DC | PRN
  Administered 2020-07-31: 15 ug via INTRATHECAL

## 2020-07-31 MED ORDER — TETANUS-DIPHTH-ACELL PERTUSSIS 5-2.5-18.5 LF-MCG/0.5 IM SUSY: 0.5000 mL | PREFILLED_SYRINGE | Freq: Once | INTRAMUSCULAR | Status: DC

## 2020-07-31 MED ORDER — OXYCODONE HCL 5 MG PO TABS
5.0000 mg | ORAL_TABLET | ORAL | Status: DC | PRN
  Administered 2020-07-31 – 2020-08-03 (×8): 5 mg via ORAL
  Filled 2020-07-31 (×8): qty 1

## 2020-07-31 MED ORDER — OXYCODONE HCL 5 MG PO TABS: 5.0000 mg | ORAL_TABLET | Freq: Once | ORAL | Status: DC | PRN

## 2020-07-31 MED ORDER — ACETAMINOPHEN 500 MG PO TABS
1000.0000 mg | ORAL_TABLET | Freq: Four times a day (QID) | ORAL | Status: DC
  Administered 2020-07-31 – 2020-08-05 (×16): 1000 mg via ORAL
  Filled 2020-07-31 (×16): qty 2

## 2020-07-31 MED ORDER — SENNOSIDES-DOCUSATE SODIUM 8.6-50 MG PO TABS
2.0000 | ORAL_TABLET | Freq: Every day | ORAL | Status: DC
  Administered 2020-08-01 – 2020-08-05 (×5): 2 via ORAL
  Filled 2020-07-31 (×5): qty 2

## 2020-07-31 MED ORDER — OXYTOCIN-SODIUM CHLORIDE 30-0.9 UT/500ML-% IV SOLN
INTRAVENOUS | Status: DC | PRN
  Administered 2020-07-31: 30 [IU] via INTRAVENOUS

## 2020-07-31 MED ORDER — WITCH HAZEL-GLYCERIN EX PADS: 1.0000 "application " | MEDICATED_PAD | CUTANEOUS | Status: DC | PRN

## 2020-07-31 MED ORDER — FENTANYL CITRATE (PF) 100 MCG/2ML IJ SOLN
INTRAMUSCULAR | Status: AC
  Filled 2020-07-31: qty 2

## 2020-07-31 MED ORDER — PROMETHAZINE HCL 25 MG/ML IJ SOLN: 6.2500 mg | INTRAMUSCULAR | Status: DC | PRN

## 2020-07-31 MED ORDER — MORPHINE SULFATE (PF) 0.5 MG/ML IJ SOLN
INTRAMUSCULAR | Status: DC | PRN
  Administered 2020-07-31: 150 ug via INTRATHECAL

## 2020-07-31 MED ORDER — BUPIVACAINE IN DEXTROSE 0.75-8.25 % IT SOLN
INTRATHECAL | Status: DC | PRN
  Administered 2020-07-31: 1.6 mL via INTRATHECAL

## 2020-07-31 MED ORDER — ZOLPIDEM TARTRATE 5 MG PO TABS: 5.0000 mg | ORAL_TABLET | Freq: Every evening | ORAL | Status: DC | PRN

## 2020-07-31 MED ORDER — FENTANYL CITRATE (PF) 100 MCG/2ML IJ SOLN
25.0000 ug | INTRAMUSCULAR | Status: DC | PRN
  Administered 2020-07-31: 50 ug via INTRAVENOUS

## 2020-07-31 MED ORDER — PHENYLEPHRINE HCL-NACL 20-0.9 MG/250ML-% IV SOLN
INTRAVENOUS | Status: AC
  Filled 2020-07-31: qty 250

## 2020-07-31 MED ORDER — SODIUM CHLORIDE 0.9% FLUSH: 3.0000 mL | INTRAVENOUS | Status: DC | PRN

## 2020-07-31 MED ORDER — OXYTOCIN-SODIUM CHLORIDE 30-0.9 UT/500ML-% IV SOLN: 2.5000 [IU]/h | INTRAVENOUS | Status: AC

## 2020-07-31 MED ORDER — IBUPROFEN 800 MG PO TABS: 800.0000 mg | ORAL_TABLET | Freq: Three times a day (TID) | ORAL | Status: DC

## 2020-07-31 MED ORDER — CEFAZOLIN SODIUM-DEXTROSE 2-4 GM/100ML-% IV SOLN
INTRAVENOUS | Status: AC
  Filled 2020-07-31: qty 100

## 2020-07-31 MED ORDER — DIBUCAINE (PERIANAL) 1 % EX OINT: 1.0000 "application " | TOPICAL_OINTMENT | CUTANEOUS | Status: DC | PRN

## 2020-07-31 SURGICAL SUPPLY — 40 items
APL SKNCLS STERI-STRIP NONHPOA (GAUZE/BANDAGES/DRESSINGS) ×1
BENZOIN TINCTURE PRP APPL 2/3 (GAUZE/BANDAGES/DRESSINGS) ×1 IMPLANT
CHLORAPREP W/TINT 26ML (MISCELLANEOUS) ×2 IMPLANT
CLAMP CORD UMBIL (MISCELLANEOUS) IMPLANT
CLOSURE STERI STRIP 1/2 X4 (GAUZE/BANDAGES/DRESSINGS) ×1 IMPLANT
CLOTH BEACON ORANGE TIMEOUT ST (SAFETY) ×2 IMPLANT
DRSG OPSITE POSTOP 4X10 (GAUZE/BANDAGES/DRESSINGS) ×2 IMPLANT
ELECT REM PT RETURN 9FT ADLT (ELECTROSURGICAL) ×2
ELECTRODE REM PT RTRN 9FT ADLT (ELECTROSURGICAL) ×1 IMPLANT
EXTRACTOR VACUUM KIWI (MISCELLANEOUS) IMPLANT
GLOVE BIO SURGEON STRL SZ 6.5 (GLOVE) ×2 IMPLANT
GLOVE BIOGEL PI IND STRL 7.0 (GLOVE) ×2 IMPLANT
GLOVE BIOGEL PI INDICATOR 7.0 (GLOVE) ×2
GOWN STRL REUS W/TWL LRG LVL3 (GOWN DISPOSABLE) ×4 IMPLANT
KIT ABG SYR 3ML LUER SLIP (SYRINGE) IMPLANT
NDL HYPO 25X5/8 SAFETYGLIDE (NEEDLE) IMPLANT
NDL SAFETY ECLIPSE 18X1.5 (NEEDLE) ×1 IMPLANT
NEEDLE HYPO 18GX1.5 SHARP (NEEDLE) ×2
NEEDLE HYPO 25X5/8 SAFETYGLIDE (NEEDLE) IMPLANT
NS IRRIG 1000ML POUR BTL (IV SOLUTION) ×2 IMPLANT
PACK C SECTION WH (CUSTOM PROCEDURE TRAY) ×2 IMPLANT
PAD OB MATERNITY 4.3X12.25 (PERSONAL CARE ITEMS) ×2 IMPLANT
PENCIL SMOKE EVAC W/HOLSTER (ELECTROSURGICAL) ×2 IMPLANT
RTRCTR C-SECT PINK 25CM LRG (MISCELLANEOUS) ×2 IMPLANT
STRIP CLOSURE SKIN 1/2X4 (GAUZE/BANDAGES/DRESSINGS) IMPLANT
SUT CHROMIC 1 CTX 36 (SUTURE) ×4 IMPLANT
SUT PLAIN 0 NONE (SUTURE) IMPLANT
SUT PLAIN 2 0 XLH (SUTURE) ×3 IMPLANT
SUT VIC AB 0 CT1 27 (SUTURE) ×4
SUT VIC AB 0 CT1 27XBRD ANBCTR (SUTURE) ×2 IMPLANT
SUT VIC AB 2-0 CT1 27 (SUTURE) ×2
SUT VIC AB 2-0 CT1 TAPERPNT 27 (SUTURE) ×1 IMPLANT
SUT VIC AB 3-0 CT1 27 (SUTURE)
SUT VIC AB 3-0 CT1 TAPERPNT 27 (SUTURE) IMPLANT
SUT VIC AB 4-0 KS 27 (SUTURE) ×2 IMPLANT
SYR BULB 3OZ (MISCELLANEOUS) ×2 IMPLANT
SYRINGE 10CC LL (SYRINGE) ×2 IMPLANT
TOWEL OR 17X24 6PK STRL BLUE (TOWEL DISPOSABLE) ×2 IMPLANT
TRAY FOLEY W/BAG SLVR 14FR LF (SET/KITS/TRAYS/PACK) ×2 IMPLANT
WATER STERILE IRR 1000ML POUR (IV SOLUTION) ×2 IMPLANT

## 2020-07-31 NOTE — Progress Notes (Signed)
Chart review -Low UOP: only received 1L IVF intraop, likely dry, 1L bolus ordered with maintenance 157mL/hr ordered as well, UOP not yet recorded -BP: BP overall 140s/70s, concern for elevated  BP with 155/120s but recheck WNL 130s/80s, pt asymptomatic. Continue with ordered labetalol 100mg  BID as scheduled, may call with questions. Pt declined scopolamine patch for further nausea relief, no further calls.  Continue to monitor, both boys in NICU currently.

## 2020-07-31 NOTE — Progress Notes (Addendum)
Patient with flunctuating Bps. Will range from 140s-150s over 90s, relaxed at BP checks. No pain. Patient says she last took labetalol before her c-section this morning. Next scheduled at 2200. Notified Dr. Wilhelmenia Blase of Bps to possibly give night dose early if acceptable, no new orders & will keep as scheduled for now.  Patient also with output of about 30cc/hr. No output in foley bag for about 2 hrs. LR bolus order from Dr. Wilhelmenia Blase.

## 2020-07-31 NOTE — Lactation Note (Signed)
This note was copied from a baby's chart. Lactation Consultation Note  Patient Name: [name] KPTWS'F Date: 07/31/2020 Reason for consult: Initial assessment;Primapara;1st time breastfeeding;Multiple gestation;Early term 37-38.6wks;NICU baby;Maternal endocrine disorder PCOS, IVF pregnancy, CHTN, total thyroid replacement due to thyroid CA 2011. Age:37 hours   LC in to visit with P2 Mom twins in the NICU.  RN set up DEBP and assisted Mom to pump for first time on initiation setting.  Colostrum containers provided and encouraged Mom to do breast massage and hand expression along with consistent pumping (goal of 8 or more pumpings in 24 hrs).   Reassured Mom that it was normal to start seeing drops in the days ahead.  Any EBM that she can collect, should be transported to NICU for babies.  NICU booklet and lactation brochure provided. Mom aware of lactation support available to her while babies are in NICU.   Mom started to feel sick and vomited.  LC will offer support daily and prn.   Maternal Data Has patient been taught Hand Expression?: Yes Does the patient have breastfeeding experience prior to this delivery?: No  Feeding Mother's Current Feeding Choice: Breast Milk   Lactation Tools Discussed/Used Tools: Pump;Flanges Flange Size: 24 Breast pump type: Double-Electric Breast Pump Pump Education: Setup, frequency, and cleaning;Milk Storage  Interventions Interventions: Breast feeding basics reviewed;Skin to skin;Breast massage;Hand express;DEBP  Discharge Pump: DEBP;Personal (Medela DEBP) WIC Program: No  Consult Status Consult Status: Follow-up Date: 08/01/20 Follow-up type: In-patient    Broadus John 07/31/2020, 12:50 PM

## 2020-07-31 NOTE — Progress Notes (Signed)
Patient refused scope patch per MD recommendation.

## 2020-07-31 NOTE — Progress Notes (Signed)
Verbal order from Dr. Wilhelmenia Blase for Toradol IV for 3 doses q6, since patient can not currently tolerate PO due to nausea and vomiting. Also told to adjust PO ibuprofen order that is currently in place.

## 2020-07-31 NOTE — Progress Notes (Signed)
[place] notified regarding patient nausea and vomiting x2. Received order to give Zofran 4mg  IV.

## 2020-07-31 NOTE — Interval H&P Note (Signed)
History and Physical Interval Note:  07/31/2020 7:06 AM  [name]  has presented today for surgery, with the diagnosis of 37wks Twins, history of myomectomy.  The various methods of treatment have been discussed with the patient and family. After consideration of risks, benefits and other options for treatment, the patient has consented to  Procedure(s) with comments: [place] (N/A) - RNFA if one by then as a surgical intervention.  The patient's history has been reviewed, patient examined, no change in status, stable for surgery.  I have reviewed the patient's chart and labs.  Questions were answered to the patient's satisfaction.     [name]

## 2020-07-31 NOTE — Anesthesia Postprocedure Evaluation (Signed)
Anesthesia Post Note  Patient: [name], molecular  Procedure(s) Performed: CESAREAN SECTION MULTI-GESTATIONAL (N/A )     Patient location during evaluation: PACU Anesthesia Type: Spinal Level of consciousness: awake Pain management: pain level controlled Vital Signs Assessment: post-procedure vital signs reviewed and stable Respiratory status: spontaneous breathing, respiratory function stable and patient connected to nasal cannula oxygen Cardiovascular status: blood pressure returned to baseline and stable Postop Assessment: no headache, no backache and no apparent nausea or vomiting Anesthetic complications: no   No complications documented.  Last Vitals:  Vitals:   07/31/20 1219 07/31/20 1300  BP: (!) 147/76 (!) 146/77  Pulse: (!) 58 66  Resp: 18 18  Temp: 37.4 C 36.7 C  SpO2:  99%    Last Pain:  Vitals:   07/31/20 1300  TempSrc: Axillary  PainSc: 4    Pain Goal:                   [name]

## 2020-07-31 NOTE — Progress Notes (Signed)
Spoke with Dr. Wilhelmenia Blase regarding low urinary output. Aware output is only 100 ml since last check with dayshift RN and it looks like urine is starting to lighten/clear up in catheter tubing. Pt is drinking fluids well. Aware that pt feels okay, her last BP was 121/83, and pt just ambulated with RN around unit and did great. MD orders to up maintenance LR to 125 ml/hr and continue to monitor. Call MD back if output with next check is 100 ml/4hr or less.

## 2020-07-31 NOTE — Op Note (Addendum)
Operative Note    Preoperative Diagnosis Diamniotic/dichorionic twin pregnancy Term pregnancy at 37 0/7 weeks Prior myomectomy   Postoperative Diagnosis Same  Procedure Primary low transverse c-section with two layer closure Vacuum assisted delivery of twin A Revision of scar  Surgeon Paula Compton, MD  Anesthesia Spinal  Fluids: EBL 626mL UOP155mL clear IVF 1065mL LR  Findings Twin A vertex, female Apgars 7,8 Weight pending.  Twin B footling breech Apgars 8,9 weight pending  Placentas WNL x 2 sent to path, clamp on twin A cord Tubes and ovaries WNL.  Posterior lower uterine segment palpated in back wall about 5 cm.  NICU present at delivery and evaluating for slight increase respiratory work.  D/w pt circumcision and desires x 2 when babies able  Specimen Placenta x 2 sent to pathology  Procedure Note  Patient was taken to the operating room where epidural anesthesia was found to be adequate by Allis clamp test. She was prepped and draped in the normal sterile fashion in the dorsal supine position with a leftward tilt. An appropriate time out was performed. A Pfannenstiel skin incision was then made with the scalpel incorporating her myomectomy incision and carried through to the underlying layer of fascia by sharp dissection and Bovie cautery. The fascia was nicked in the midline and the incision was extended laterally with Mayo scissors. The inferior aspect of the incision was grasped Coker clamps and dissected off the underlying rectus muscles. In a similar fashion the superior aspect was dissected off the rectus muscles. Rectus muscles were separated in the midline and the peritoneal cavity entered bluntly. The peritoneal incision was then extended both superiorly and inferiorly with careful attention to avoid both bowel and bladder. The Alexis self-retaining wound retractor was then placed within the incision and the lower uterine segment exposed. The bladder flap was  developed with Metzenbaum scissors and pushed away from the lower uterine segment. The lower uterine segment was then incised in a transverse fashion and the cavity itself entered bluntly. The incision was extended bluntly. The infant's head was then lifted and delivered up to the level of the incision but fundal pressure could not effect delivery with fundal pressure, so the Kiwi vacuum obtained.  With the vacuum inflated in the green zone and one pop off, the head was delivered.   The remainder of the infant delivered and the nose and mouth bulb suctioned with the cord clamped and cut after one minute delay. The infant was handed off to the waiting NICU team. Twin B was then noted to be footling breech and the feet were grasped with the bag then ruptured.  Gentle traction delivered the baby to the scapula and then the arms reduced over the chest.  Finally the head was delivered flexed with fundal pressure.  The cord was cut after one minute delay and handed to the NICU team. The placentas were then spontaneously expressed from the uterus and the uterus cleared of all clots and debris with moist lap sponge.  A fibroid was palpated in the posterior LUS and impinged on the cavity but did not appear to enter it.  The uterine incision was then repaired in 2 layers the first layer was a running locked layer 1-0 chromic and the second an imbricating layer of the same suture. The tubes and ovaries were inspected and the gutters cleared of all clots and debris. The uterine incision was inspected and found to be hemostatic. All instruments and sponges as well as the Chiropodist  were then removed from the abdomen. The rectus muscles and peritoneum were then reapproximated with a running suture of 2-0 Vicryl. The fascia was then closed with 0 Vicryl in a running fashion. Subcutaneous tissue was reapproximated with 2-0 plain in a running fashion. The skin was closed with a subcuticular stitch of 4-0 Vicryl on a Keith  needle and then reinforced with benzoin and Steri-Strips. At the conclusion of the procedure all instruments and sponge counts were correct. Patient was taken to the recovery room in good condition with baby B skin to skin and baby B being observed for slight work with respiration.

## 2020-07-31 NOTE — Transfer of Care (Signed)
Immediate Anesthesia Transfer of Care Note  Patient: [name]  Procedure(s) Performed: CESAREAN SECTION MULTI-GESTATIONAL (N/A )  Patient Location: PACU  Anesthesia Type:Spinal  Level of Consciousness: awake, alert , oriented and patient cooperative  Airway & Oxygen Therapy: Patient Spontanous Breathing  Post-op Assessment: Report given to RN and Post -op Vital signs reviewed and stable  Post vital signs: Reviewed and stable  Last Vitals:  Vitals Value Taken Time  BP 140/76 07/31/20 0852  Temp    Pulse 73 07/31/20 0855  Resp 17 07/31/20 0855  SpO2 100 % 07/31/20 0855  Vitals shown include unvalidated device data.  Last Pain:  Vitals:   07/31/20 0612  TempSrc:   PainSc: 0-No pain         Complications: No complications documented.

## 2020-07-31 NOTE — Progress Notes (Signed)
I received a consult that pt requested information about creating or updating an advance directive.  I met with her and she would not like to fill one out at this time, but was appreciative of the information.  I facilitated sharing about her delivery and her babies' somewhat unexpected admissions to NICU given that they were 37 weeks.  She is in good spirits and is well supported.  I brought her the information on creating an advance directive.  Chaplain Janne Napoleon, Bcc Pager, [phone] 12:19 PM

## 2020-07-31 NOTE — Anesthesia Procedure Notes (Signed)
Spinal  Patient location during procedure: OR Start time: 07/31/2020 7:20 AM End time: 07/31/2020 7:25 AM Staffing Performed: anesthesiologist  Anesthesiologist: Murvin Natal, MD Preanesthetic Checklist Completed: patient identified, IV checked, risks and benefits discussed, surgical consent, monitors and equipment checked, pre-op evaluation and timeout performed Spinal Block Patient position: sitting Prep: DuraPrep Patient monitoring: cardiac monitor, continuous pulse ox and blood pressure Approach: midline Location: L4-5 Injection technique: single-shot Needle Needle type: Pencan  Needle gauge: 24 G Needle length: 9 cm Assessment Sensory level: T10 Additional Notes Functioning IV was confirmed and monitors were applied. Sterile prep and drape, including hand hygiene and sterile gloves were used. The patient was positioned and the spine was prepped. The skin was anesthetized with lidocaine.  Free flow of clear CSF was obtained prior to injecting local anesthetic into the CSF.  The spinal needle aspirated freely following injection.  The needle was carefully withdrawn.  The patient tolerated the procedure well.

## 2020-08-01 LAB — CBC
HCT: 32.2 % — ABNORMAL LOW (ref 36.0–46.0)
Hemoglobin: 10.7 g/dL — ABNORMAL LOW (ref 12.0–15.0)
MCH: 31.9 pg (ref 26.0–34.0)
MCHC: 33.2 g/dL (ref 30.0–36.0)
MCV: 96.1 fL (ref 80.0–100.0)
Platelets: 191 10*3/uL (ref 150–400)
RBC: 3.35 MIL/uL — ABNORMAL LOW (ref 3.87–5.11)
RDW: 13.7 % (ref 11.5–15.5)
WBC: 11.2 10*3/uL — ABNORMAL HIGH (ref 4.0–10.5)
nRBC: 0 % (ref 0.0–0.2)

## 2020-08-01 NOTE — Progress Notes (Signed)
Subjective: Postpartum Day 1: Cesarean Delivery Patient reports tolerating PO and + flatus.  Pain well controlled with medication. Pt reports nausea resolved today. She denies HA, CP, SOB. Babies still in NICU/ Plans to go and see them shortly. In good spirits.   Objective: Vital signs in last 24 hours: Temp:  [97.9 F (36.6 C)-99.3 F (37.4 C)] 98.2 F (36.8 C) (02/18 0530) Pulse Rate:  [58-70] 62 (02/18 0931) Resp:  [17-20] 18 (02/18 0530) BP: (121-147)/(69-83) 133/80 (02/18 0931) SpO2:  [97 %-100 %] 99 % (02/18 0530)  Physical Exam:  General: alert, cooperative and no distress Lochia: appropriate Uterine Fundus: firm Incision: no significant drainage DVT Evaluation: No evidence of DVT seen on physical exam. No significant calf/ankle edema.  Recent Labs    08/01/20 0527  HGB 10.7*  HCT 32.2*    Assessment/Plan: Status post Cesarean section. Doing well postoperatively.  Continue current care Monitor BP Foley catheter removed ( 244ml/4hrs) - monitor UOP.  Isaiah Serge 08/01/2020, 9:43 AM

## 2020-08-01 NOTE — Clinical Social Work Maternal (Signed)
CLINICAL SOCIAL WORK MATERNAL/CHILD NOTE  Patient Details  Name: [name] MRN: [id number] Date of Birth: 05/05/1986  Date:  2021-04-20  Clinical Social Worker Initiating Note:  [name] Date/Time: Initiated:  08/01/20/1133     Child's Name:  [name] and [name]   Biological Parents:  Mother   Need for Interpreter:  None   Reason for Referral:  Behavioral Health Concerns   Address:  [address] [place] [address]    Phone number:  [phone] (home)     Additional phone number:   Household Members/Support Persons (HM/SP):       HM/SP Name Relationship DOB or Age  HM/SP -1        HM/SP -2        HM/SP -3        HM/SP -4        HM/SP -5        HM/SP -6        HM/SP -7        HM/SP -8          Natural Supports (not living in the home):  Extended IT trainer Supports: None   Employment: Full-time   Type of Work: HR with [employer]   Education:  Production designer, theatre/television/film   Homebound arranged:    Museum/gallery curator Resources:  Multimedia programmer   Other Resources:      Cultural/Religious Considerations Which May Impact Care:    Strengths:  Ability to meet basic needs ,Pediatrician chosen,Home prepared for child    Psychotropic Medications:         Pediatrician:    [facility] area  Pediatrician List:   [facility]  [place]      Pediatrician Fax Number:    Risk Factors/Current Problems:      Cognitive State:  Alert ,Able to Concentrate ,Insightful ,Linear Thinking    Mood/Affect:  Comfortable ,Interested ,Happy ,Relaxed    CSW Assessment: CSW met with MOB in her Fifth floor room/524 to introduce myself, offer support and complete assessment due to NICU admission.  When CSW arrived, MOB was resting in bed and MOB's mother was resting on the couch. CSW explained CSW's role  and MOB gave CSW permission to complete assessment while her mother was present [name]). MOB was pleasant and receptive to CSW intervention. MOB's MOB appeared to be a support for MOB and was also engaged and asked appropriate questions.   MOB reported feeling well today and seems to have a good understanding of twin's medical situation at this time as she was able to update CSW on their status.    CWS asked about MOB's MH hx and MOB denied a hx. There were no concerns noted in MOB's OB record so did not address the hx of anxiety and depression.  However, CSW provided education regarding PPD signs and symptoms to watch for and asked that MOB commit to talking with CSW and or her doctor if symptoms arise at any time; MOB agreed.  CSW also discussed common emotions often experienced during the first two weeks after delivery, keeping in mind the separation that is inevitably caused by twins admission to NICU.  MOB reports having all needed baby supplies at home and has chosen a pediatrician.  MOB states no further questions, concerns or needs at this time.  CSW explained ongoing support services  offered by NICU CSW and provided contact information.  MOB seemed very appreciative of the visit and thanked CSW.  CSW will continue to offer resources and supports to family while infant remains in NICU.   CSW Plan/Description:  No Further Intervention Required/No Barriers to Discharge,Sudden Infant Death Syndrome (SIDS) Education,Perinatal Mood and Anxiety Disorder (PMADs) Education,Other Patient/Family Education,Other Information/Referral to [name], MSW, [employer] Social Work [phone]  Gloria Nanas, LCSW 08/01/2020, 11:37 AM

## 2020-08-01 NOTE — Discharge Instructions (Signed)
Call office with any concerns [phone] 

## 2020-08-02 LAB — CBC
HCT: 37.1 % (ref 36.0–46.0)
Hemoglobin: 12.8 g/dL (ref 12.0–15.0)
MCH: 32.7 pg (ref 26.0–34.0)
MCHC: 34.5 g/dL (ref 30.0–36.0)
MCV: 94.6 fL (ref 80.0–100.0)
Platelets: 261 10*3/uL (ref 150–400)
RBC: 3.92 MIL/uL (ref 3.87–5.11)
RDW: 14 % (ref 11.5–15.5)
WBC: 10 10*3/uL (ref 4.0–10.5)
nRBC: 0 % (ref 0.0–0.2)

## 2020-08-02 LAB — HEPATIC FUNCTION PANEL
ALT: 18 U/L (ref 0–44)
AST: 27 U/L (ref 15–41)
Albumin: 2.3 g/dL — ABNORMAL LOW (ref 3.5–5.0)
Alkaline Phosphatase: 144 U/L — ABNORMAL HIGH (ref 38–126)
Bilirubin, Direct: <0.1 mg/dL (ref 0.0–0.2)
Total Bilirubin: 0.4 mg/dL (ref 0.3–1.2)
Total Protein: 4.9 g/dL — ABNORMAL LOW (ref 6.5–8.1)

## 2020-08-02 LAB — PROTEIN / CREATININE RATIO, URINE
Creatinine, Urine: 23.33 mg/dL
Protein Creatinine Ratio: 0.56 mg/mg{Cre} — ABNORMAL HIGH (ref 0.00–0.15)
Total Protein, Urine: 13 mg/dL

## 2020-08-02 MED ORDER — NIFEDIPINE ER OSMOTIC RELEASE 30 MG PO TB24
30.0000 mg | ORAL_TABLET | Freq: Every day | ORAL | Status: DC
  Administered 2020-08-02 – 2020-08-03 (×2): 30 mg via ORAL
  Filled 2020-08-02 (×3): qty 1

## 2020-08-02 NOTE — Progress Notes (Addendum)
Subjective: Postpartum Day 2: Cesarean Delivery Patient reports tolerating PO, + flatus and no problems voiding.  Feels bloated but otherwise no complaints; abdominal binder helping. She denies HA, visual changes, CP or SOB. She just returned from seeing babies in NICU.   Objective: Vital signs in last 24 hours: Temp:  [98.3 F (36.8 C)-98.7 F (37.1 C)] 98.3 F (36.8 C) (02/19 0600) Pulse Rate:  [61-68] 67 (02/19 0600) Resp:  [16-18] 18 (02/19 0600) BP: (131-142)/(74-86) 142/85 (02/19 0600) SpO2:  [99 %] 99 % (02/18 2058)  Physical Exam:  General: alert, cooperative and no distress Lochia: appropriate Uterine Fundus: mild distension Incision: no significant drainage DVT Evaluation: No evidence of DVT seen on physical exam. No significant calf/ankle edema.  Recent Labs    08/01/20 0527  HGB 10.7*  HCT 32.2*    Assessment/Plan: Status post Cesarean section. Doing well postoperatively.  Continue current care Monitor BP- labetalol due now; will also check preE labs. Gave pt precautions. She will want circumcisions for both boys prior to them getting discharged home  Isaiah Serge 08/02/2020, 10:20 AM

## 2020-08-02 NOTE — Lactation Note (Signed)
This note was copied from a baby's chart. 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  Mom holding baby boy B "[name]".  Recommended and assisted Mom to hold baby STS on her chest.  Babies are both being gavage fed.  Baby A "[name]" is on 4L HFNC at 30% O2.    Baby B not showing any feeding cues currently.  Encouraged STS as much as possible  Mom has been pumping every 3 hrs, expressing small drop to rub on her nipple only.  Reassured her that consistent pumping and breast massage and hand expression along with STS is recommended.  Reviewed breast massage and hand expression.  Glistening of colostrum noted on nipple.  Talked about rubbing drops on baby's lips.  Mom denies any questions currently.   Mom aware of lactation support available through her baby's nurses.  Mom to be discharged tomorrow.   Lactation Tools Discussed/Used Tools: Pump;Flanges Flange Size: 24 Breast pump type: Double-Electric Breast Pump Pumping frequency: Q3 Pumped volume: 0 mL  Interventions Interventions: Breast feeding basics reviewed;Skin to skin;Breast massage;Hand express;DEBP;Education  Discharge WIC Program: No  Consult Status Consult Status: Follow-up Date: 08/03/20 Follow-up type: In-patient    Broadus John 08/02/2020, 8:17 AM

## 2020-08-02 NOTE — Progress Notes (Addendum)
Notified Dr. Terri Piedra 2101 that patients blood pressure was 152/89 pulse  75 Patient  Had labetalol ordered for 2200 called to see if she wanted to give patient anything in addition. Dr. Vincenza Hews states to discontinue the labetalol and give the patient procardia 30mg  xl Q -day.States to monitor the patients blood pressure Q 4 hours . Patient denies any headaches ,blurred vision ,or any other s/s of hypertension.

## 2020-08-03 MED ORDER — LACTATED RINGERS IV SOLN: INTRAVENOUS | Status: DC

## 2020-08-03 MED ORDER — MAGNESIUM SULFATE BOLUS VIA INFUSION
4.0000 g | Freq: Once | INTRAVENOUS | Status: AC
  Administered 2020-08-03: 4 g via INTRAVENOUS
  Filled 2020-08-03: qty 1000

## 2020-08-03 MED ORDER — LABETALOL HCL 5 MG/ML IV SOLN
20.0000 mg | INTRAVENOUS | Status: DC | PRN
  Administered 2020-08-03: 20 mg via INTRAVENOUS
  Filled 2020-08-03: qty 4

## 2020-08-03 MED ORDER — MAGNESIUM SULFATE 40 GM/1000ML IV SOLN
2.0000 g/h | INTRAVENOUS | Status: DC
  Administered 2020-08-03 – 2020-08-04 (×2): 2 g/h via INTRAVENOUS
  Filled 2020-08-03 (×3): qty 1000

## 2020-08-03 MED ORDER — LABETALOL HCL 5 MG/ML IV SOLN
40.0000 mg | INTRAVENOUS | Status: DC | PRN
Start: 2020-08-03 — End: 2020-08-05

## 2020-08-03 MED ORDER — HYDRALAZINE HCL 20 MG/ML IJ SOLN: 10.0000 mg | INTRAMUSCULAR | Status: DC | PRN

## 2020-08-03 MED ORDER — LABETALOL HCL 5 MG/ML IV SOLN: 80.0000 mg | INTRAVENOUS | Status: DC | PRN

## 2020-08-03 MED ORDER — NIFEDIPINE ER OSMOTIC RELEASE 30 MG PO TB24
30.0000 mg | ORAL_TABLET | Freq: Once | ORAL | Status: AC
  Administered 2020-08-03: 30 mg via ORAL

## 2020-08-03 NOTE — Lactation Note (Signed)
This note was copied from a baby's chart. Lactation Consultation Note  Patient Name: Kit Brubacher JOITG'P Date: 08/03/2020 Reason for consult: NICU baby;Follow-up assessment;Multiple gestation Age:37 hours  Mom to d/c today. She continues to pump q3 hours with increasing amounts of colostrum. She has latched baby B; baby A not yet able to bf. She is aware of LC support. Will plan f/u visit to assist with bf prn.   Feeding Mother's Current Feeding Choice: Breast Milk and Donor Milk   Lactation Tools Discussed/Used Pumping frequency: q3  Interventions Interventions: Breast feeding basics reviewed;Education   Consult Status Consult Status: Follow-up Follow-up type: In-patient   Gwynne Edinger, MA IBCLC 08/03/2020, 1:42 PM

## 2020-08-03 NOTE — Progress Notes (Addendum)
Patient transferred to ob specialty unit @ 2120 due to increase blood pressure per Dr. Terri Piedra orders to start magnesium  .

## 2020-08-03 NOTE — Progress Notes (Signed)
Patient ID: [name], female   DOB: May 19, 1986, 35 y.o.   MRN: [id number] Pt reports feels well. She denies HA, blurry vision, CP or SOB. She has been up and ambulated to NICU earlier today. Pain well controlled. Lochia mild.  She was switched from labetalol 100 mg to procardia 30mg  XL last night. Tolerated well.   VS: 138-140/78-87, 79 GEN - NAD ABD - FF EXT - no homans  Labs last night: 10>12<261 Upr/Cr 0.56  ALT- 18, AST 27  A/P: POD#3 s/p c/s for twin gestation with hx myomectomy  -  Continue to monitor BP q 4hrs; adjust procardia prn - Routine pp/post operative care - Ok to discharge once stable BPs and medication regimen established

## 2020-08-03 NOTE — Progress Notes (Signed)
Dr Terri Piedra called@ 0023 an stated that if the patients diastolic blood pressure is higher than 90 or systolic blood pressure is higher than 153 to call her.

## 2020-08-03 NOTE — Progress Notes (Signed)
Patient ID: [name], female   DOB: 20-Jul-1985, 35 y.o.   MRN: [id number] Pt reports had mild HA earlier - not currently present. Feels tired otherwise well.  I reviewed her last few BP with her; still elevated despite procardia 30xl given at  1644 today.   VS: 149-157/95, 54  GEN - NAD  A/P: POD#3 s/p c/s for di/di twins - both in NICU         Chronic HTN with likely overlying preE per sx, proteinuria and BP                  - switched from labetalol to procardia - she received 30xl last night at 10pm, another dose at 1644 today and due for next dose at 10pm tonight. Plans to switch to 60xl in am daily instead.                  - Start on MgSO4 for seizure prophylaxis ; reviewed sx with pt                      Routine postpartum/post operative care

## 2020-08-04 LAB — CBC
HCT: 38.5 % (ref 36.0–46.0)
Hemoglobin: 13.3 g/dL (ref 12.0–15.0)
MCH: 32.5 pg (ref 26.0–34.0)
MCHC: 34.5 g/dL (ref 30.0–36.0)
MCV: 94.1 fL (ref 80.0–100.0)
Platelets: 372 10*3/uL (ref 150–400)
RBC: 4.09 MIL/uL (ref 3.87–5.11)
RDW: 13.9 % (ref 11.5–15.5)
WBC: 9.1 10*3/uL (ref 4.0–10.5)
nRBC: 0 % (ref 0.0–0.2)

## 2020-08-04 LAB — COMPREHENSIVE METABOLIC PANEL
ALT: 44 U/L (ref 0–44)
AST: 61 U/L — ABNORMAL HIGH (ref 15–41)
Albumin: 2.8 g/dL — ABNORMAL LOW (ref 3.5–5.0)
Alkaline Phosphatase: 152 U/L — ABNORMAL HIGH (ref 38–126)
Anion gap: 9 (ref 5–15)
BUN: 8 mg/dL (ref 6–20)
CO2: 28 mmol/L (ref 22–32)
Calcium: 7.8 mg/dL — ABNORMAL LOW (ref 8.9–10.3)
Chloride: 101 mmol/L (ref 98–111)
Creatinine, Ser: 0.73 mg/dL (ref 0.44–1.00)
GFR, Estimated: >60 mL/min (ref >60)
Glucose, Bld: 115 mg/dL — ABNORMAL HIGH (ref 70–99)
Potassium: 4.9 mmol/L (ref 3.5–5.1)
Sodium: 138 mmol/L (ref 135–145)
Total Bilirubin: 0.3 mg/dL (ref 0.3–1.2)
Total Protein: 5.7 g/dL — ABNORMAL LOW (ref 6.5–8.1)

## 2020-08-04 LAB — SURGICAL PATHOLOGY

## 2020-08-04 MED ORDER — NIFEDIPINE ER OSMOTIC RELEASE 30 MG PO TB24: 30.0000 mg | ORAL_TABLET | Freq: Once | ORAL | Status: DC

## 2020-08-04 MED ORDER — LABETALOL HCL 100 MG PO TABS
100.0000 mg | ORAL_TABLET | Freq: Two times a day (BID) | ORAL | Status: DC
Start: 2020-08-04 — End: 2020-08-05
  Administered 2020-08-04 – 2020-08-05 (×3): 100 mg via ORAL
  Filled 2020-08-04 (×3): qty 1

## 2020-08-04 MED ORDER — NIFEDIPINE ER OSMOTIC RELEASE 30 MG PO TB24
60.0000 mg | ORAL_TABLET | Freq: Once | ORAL | Status: AC
  Administered 2020-08-04: 60 mg via ORAL
  Filled 2020-08-04: qty 2

## 2020-08-04 MED ORDER — NIFEDIPINE ER OSMOTIC RELEASE 30 MG PO TB24
60.0000 mg | ORAL_TABLET | Freq: Every day | ORAL | Status: DC
  Administered 2020-08-05: 60 mg via ORAL
  Filled 2020-08-04: qty 2

## 2020-08-04 NOTE — Lactation Note (Signed)
This note was copied from a baby's chart. Lactation Consultation Note  Patient Name: [name] UUEKC'M Date: 08/04/2020 Reason for consult: Follow-up assessment;Primapara;1st time breastfeeding;Multiple gestation;Early term 37-38.6wks;NICU baby;Maternal endocrine disorder Age:37 days  LC visited with P2 Mom of twins in the NICU.  Both babies receiving MOM/DBM by gavage.  Mom was transferred last evening to [facility] for 24 hrs of MgSO4 due to increasing hypertension.  Mom has continued to consistently pump and is expressing increased volumes now (30+ml)  Mom aware of benefits of hand's on pumping while wearing her pumping bra. Encouraged Mom to pump every 2-3 hrs while awake.  Breasts are fuller, but softening after pumping.  Mom is exhausted this am and plans to sleep. Baby B "[name]" latched to breast yesterday and suckled a little.  Mom would like lactation assistance tomorrow.  Appt made for 9 am.  LC disassembled and washed, rinsed and air dried pump parts in drying bin.  Washing bin provided.    Mom aware of lactation support while babies in the NICU.  Lactation Tools Discussed/Used Tools: Pump;Flanges Flange Size: 24 Breast pump type: Double-Electric Breast Pump Pumping frequency: Q3 Pumped volume: 30 mL  Interventions Interventions: Education;Skin to skin;Breast massage;Hand express;DEBP;Expressed milk   Consult Status Consult Status: Follow-up Date: 08/05/20 Follow-up type: In-patient    [name] 08/04/2020, 8:05 AM

## 2020-08-04 NOTE — Progress Notes (Signed)
Subjective: Postpartum Day 4 Cesarean Delivery?CHTN/pp preeclampsia Patient reports tolerating PO and no problems voiding.  Passing small amount of flatus but no BM.  Pain well-controlled. No HA this AM  Objective: Vital signs in last 24 hours: Temp:  [97.7 F (36.5 C)] 97.7 F (36.5 C) (02/20 1613) Pulse Rate:  [63-86] 86 (02/21 0700) Resp:  [15-20] 18 (02/21 0700) BP: (125-170)/(74-95) 143/87 (02/21 0700) SpO2:  [97 %-100 %] 99 % (02/21 0700)  UOP diuresed over 5 liters last shift  Physical Exam:  General: alert and cooperative Lochia: appropriate Uterine Fundus: firm Incision: Dressing intact, small amount of dried blood noted DVT Evaluation: No evidence of DVT seen on physical exam.  Recent Labs    08/02/20 1119  HGB 12.8  HCT 37.1    Assessment/Plan: Status post Cesarean section. Postoperative course complicated by preeclampsia and started on magnesium last pm for some severe range BP and HA..  BP improved on magnesium but did require IV labetalol x 1.  Will restart her labetalol 100mg  po BID with the procardia as this is her baseline medication for her CHTN, and will stay on that once procardia weaned off. Continue magnesium until this PM and then evaluate for d/c.  Per pt started about 2300pm. Babies doing well in NICU.  One on CPAP but weaning and the other on room air.  Working on feeds.    Logan Bores 08/04/2020, 8:53 AM

## 2020-08-04 NOTE — Progress Notes (Signed)
Patient ID: [name], female   DOB: 1985/12/03, 35 y.o.   MRN: [id number] PM check Pt doing well, anxious to go visit babies.  Mild HA here and there but nothing severe.  Pain controlled from incision and some flatus, no BM  BP stable on procardia 60XL q AM and labetalol 100mg  po BID Has diuresed over 10 liters in last 2 shifts CMP WNL except AST /Alt slightly up at 61/44  Will d/c magnesium and follow BP Increase ambulation to encourage GI function--declines suppository Recheck labs in AM Hopefully if BP stable off magnesium, d/c home tomorrow

## 2020-08-05 ENCOUNTER — Other Ambulatory Visit: Payer: Self-pay | Admitting: Obstetrics and Gynecology

## 2020-08-05 LAB — COMPREHENSIVE METABOLIC PANEL
ALT: 32 U/L (ref 0–44)
AST: 36 U/L (ref 15–41)
Albumin: 2.5 g/dL — ABNORMAL LOW (ref 3.5–5.0)
Alkaline Phosphatase: 123 U/L (ref 38–126)
Anion gap: 13 (ref 5–15)
BUN: 11 mg/dL (ref 6–20)
CO2: 24 mmol/L (ref 22–32)
Calcium: 7.8 mg/dL — ABNORMAL LOW (ref 8.9–10.3)
Chloride: 102 mmol/L (ref 98–111)
Creatinine, Ser: 0.73 mg/dL (ref 0.44–1.00)
GFR, Estimated: >60 mL/min (ref >60)
Glucose, Bld: 83 mg/dL (ref 70–99)
Potassium: 4.1 mmol/L (ref 3.5–5.1)
Sodium: 139 mmol/L (ref 135–145)
Total Bilirubin: 0.3 mg/dL (ref 0.3–1.2)
Total Protein: 5 g/dL — ABNORMAL LOW (ref 6.5–8.1)

## 2020-08-05 LAB — CBC
HCT: 37.1 % (ref 36.0–46.0)
Hemoglobin: 12.4 g/dL (ref 12.0–15.0)
MCH: 31.6 pg (ref 26.0–34.0)
MCHC: 33.4 g/dL (ref 30.0–36.0)
MCV: 94.6 fL (ref 80.0–100.0)
Platelets: 352 10*3/uL (ref 150–400)
RBC: 3.92 MIL/uL (ref 3.87–5.11)
RDW: 13.6 % (ref 11.5–15.5)
WBC: 9.3 10*3/uL (ref 4.0–10.5)
nRBC: 0 % (ref 0.0–0.2)

## 2020-08-05 MED ORDER — OXYCODONE HCL 5 MG PO TABS: 5.0000 mg | ORAL_TABLET | Freq: Four times a day (QID) | ORAL | 0 refills | Status: AC | PRN

## 2020-08-05 MED ORDER — IBUPROFEN 800 MG PO TABS: 800.0000 mg | ORAL_TABLET | Freq: Three times a day (TID) | ORAL | 1 refills | Status: AC | PRN

## 2020-08-05 MED ORDER — NIFEDIPINE ER 60 MG PO TB24: 60.0000 mg | ORAL_TABLET | Freq: Every day | ORAL | 1 refills | Status: AC

## 2020-08-05 MED ORDER — SENNOSIDES-DOCUSATE SODIUM 8.6-50 MG PO TABS: 2.0000 | ORAL_TABLET | Freq: Every day | ORAL | 0 refills | Status: AC

## 2020-08-05 MED FILL — oxyCODONE HCL 5 MG TABS: 5 | 7 days supply | Qty: 30 | Fill #0

## 2020-08-05 MED FILL — IBUPROFEN 800 MG TAB: 800 | 20 days supply | Qty: 60 | Fill #0

## 2020-08-05 MED FILL — NIFEdipine ER 60 MG TB24: 60 | 30 days supply | Qty: 30 | Fill #0

## 2020-08-05 MED FILL — SENEXON-S 8.6-50 MG TABS: 8.6-50 | 15 days supply | Qty: 30 | Fill #0

## 2020-08-05 NOTE — Plan of Care (Signed)
  Problem: Clinical Measurements: Goal: Ability to maintain clinical measurements within normal limits will improve Outcome: Adequate for Discharge Goal: Will remain free from infection Outcome: Adequate for Discharge Goal: Diagnostic test results will improve Outcome: Adequate for Discharge   Problem: Activity: Goal: Risk for activity intolerance will decrease Outcome: Adequate for Discharge   Problem: Elimination: Goal: Will not experience complications related to bowel motility Outcome: Adequate for Discharge

## 2020-08-05 NOTE — Progress Notes (Signed)
POSTPARTUM POSTOP PROGRESS NOTE  POD #5  Subjective:  No acute events overnight.  Pt denies problems with ambulating, voiding or po intake.  She denies nausea or vomiting.  Pain is well controlled.  She has had flatus. She has not had bowel movement.  Lochia Minimal. Denies PreE symptoms this morning, HA absent at this time. Baby boys stable upstairs, has lactation meeting at 0900. Happy to be off of MgSo4  Objective: Blood pressure (!) 142/84, pulse 73, temperature 98.5 F (36.9 C), temperature source Oral, resp. rate 17, height 5\' 6"  (1.676 m), weight 91.6 kg, last menstrual period 10/01/2019, SpO2 98 %, unknown if currently breastfeeding.  Physical Exam:  General: alert, cooperative and no distress Lochia:normal flow Chest: CTAB Heart: RRR no m/r/g Abdomen: +BS, soft, nontender Uterine Fundus: firm, 2cm below umbilicus. Honeycomb dressing intact, negdrainage Extremities: neg edema, neg calf TTP BL, neg Homans BL  Recent Labs    08/04/20 1632 08/05/20 0546  HGB 13.3 12.4  HCT 38.5 37.1  LFT trend: 61/44 > 36/32 (2/21 > 2/22)  Assessment/Plan:  ASSESSMENT: [name] is a 35 y.o. G1P1002 s/p PLTCS @ [redacted]w[redacted]d for h/o myomectomy w/ di-di- TIUP. PNC c/b Hypo T on meds, CHTN on Lab 100mg  BID, AMA, IVF with donor sperm.  Postoperative course complicated by SI PreE (SRBP). S/p 24hr MgSO4 and added Procardia 60XL with Bps 130s-140s/80s-90s. Asx today, improving LFTs  Discharge home and Breastfeeding  Desires circumcision for baby boys x2 once stable. Aware for BP/incision f/u next week in addition to routine pp visit   LOS: 5 days

## 2020-08-05 NOTE — Lactation Note (Signed)
This note was copied from a baby's chart. Lactation Consultation Note  Patient Name: [name] [id number]'[id number] Date: 08/05/2020 Reason for consult: Follow-up assessment;Primapara;1st time breastfeeding;Early term 37-38.6wks;NICU baby;Infant weight loss;Other (Comment) (per NICU RN baby to sleepy to latch - tube fed) Age:37 days  Maternal Data    Feeding Mother's Current Feeding Choice: Breast Milk and Donor Milk  LATCH Score                    Lactation Tools Discussed/Used    Interventions    Discharge    Consult Status Consult Status: Follow-up Date: 08/06/20 Follow-up type: In-patient    [place] 08/05/2020, 9:58 AM

## 2020-08-05 NOTE — Discharge Summary (Signed)
Postpartum Discharge Summary  Date of Service updated     Patient Name: [name] DOB: [birth year] MRN: [id number]  Date of admission: 07/31/2020 Delivery date:   Anallely, Rosell [[id number]]  07/31/2020    Tennyson, Kallen [[id number]]  07/31/2020   Delivering provider:    Burna Sis Yeilyn [[id number]]  Paula Compton    Biasi, Macon [[id number]]  Paula Compton   Date of discharge: 08/05/2020  Admitting diagnosis: S/P primary low transverse C-section [Z98.891] Intrauterine pregnancy: [redacted]w[redacted]d     Secondary diagnosis:  Active Problems:   S/P primary low transverse C-section  Additional problems: SI PreE    Discharge diagnosis: Term Pregnancy Delivered and CHTN with superimposed preeclampsia                                              Post partum procedures:MgSO4 Complications: None  Hospital course: Sceduled C/S   35 y.o. yo G1P1002 at [redacted]w[redacted]d was admitted to the hospital 07/31/2020 for scheduled cesarean section with the following indication:Prior Uterine Surgery.Delivery details are as follows:  Membrane Rupture Time/Date:    Purity, Irmen [[id number]]  7:48 AM    Shara, Hartis [[id number]]  7:54 AM  ,   Desma, Wilkowski [[id number]]  07/31/2020    Laquana, Villari [[id number]]  07/31/2020    Delivery Method:   Burna Sis Pamila [[id number]]  C-Section, Low Transverse    Juba, LILLYBETH TAL [[id number]]  C-Section, Low Transverse   Details of operation can be found in separate operative note.  Patient is ambulating, tolerating a regular diet, passing flatus, and urinating well. Postoperative course complicated by superimposed PreE requiring 24hrs MgSO4 for seizure prophylaxis with appropriate diuresis. Nif 60XL added to medication regimen with appropriate control on Bps. Patient is discharged home in stable condition on  08/05/20        Newborn Data: Birth date:   Jaleen, Grupp [[id number]]  07/31/2020    Rosmarie, Esquibel  [[id number]]  07/31/2020   Birth time:   Anysa, Tacey [[id number]]  7:52 AM    Kizzie Furnish, Neysa Bonito [[id number]]  7:55 AM   Gender:   Quyen, Cutsforth [[id number]]  Female    Billye, Pickerel [[id number]]  Female   Living status:   Shalicia, [place] [[id number]]  GYKZLD    JTTSVXB, [place] [[id number]]  Living   Apgars:   Kyesha, Balla [[id number]]  [address] TALIBAH COLASURDO [[id number]]  8  ,   Jevaeh, Shams [[id number]]  8    Yesena, Reaves [[id number]]  9   Weight:   Margalit, Leece [[id number]]  2870 g    Annastacia, Duba [[id number]]  2790 g      Magnesium Sulfate received: Yes: Seizure prophylaxis  Physical exam  Vitals:   08/04/20 1930 08/05/20 0004 08/05/20 0700 08/05/20 0735  BP: 132/72 (!) 142/71 140/79 (!) 142/84  Pulse: 87 91 75 73  Resp: 17 16 18 17   Temp: 98 F (36.7 C) 98.3 F (36.8 C) 98.5 F (36.9 C) 98.5 F (36.9 C)  TempSrc: Oral Oral Oral Oral  SpO2: 100% 97%  98%  Weight:      Height:       General: alert, cooperative and no distress Lochia: appropriate Uterine Fundus: firm Incision: Healing well with no significant drainage, No significant  erythema, Dressing is clean, dry, and intact DVT Evaluation: No evidence of DVT seen on physical exam. Negative Homan's sign. No cords or calf tenderness. Labs: Lab Results  Component Value Date   WBC 9.3 08/05/2020   HGB 12.4 08/05/2020   HCT 37.1 08/05/2020   MCV 94.6 08/05/2020   PLT 352 08/05/2020   CMP Latest Ref Rng & Units 08/05/2020  Glucose 70 - 99 mg/dL 83  BUN 6 - 20 mg/dL 11  Creatinine 0.44 - 1.00 mg/dL 0.73  Sodium 135 - 145 mmol/L 139  Potassium 3.5 - 5.1 mmol/L 4.1  Chloride 98 - 111 mmol/L 102  CO2 22 - 32 mmol/L 24  Calcium 8.9 - 10.3 mg/dL 7.8(L)  Total Protein 6.5 - 8.1 g/dL 5.0(L)  Total Bilirubin 0.3 - 1.2 mg/dL 0.3  Alkaline Phos 38 - 126 U/L 123  AST 15 - 41 U/L 36  ALT 0 - 44 U/L 32   Edinburgh Score: Edinburgh Postnatal  Depression Scale Screening Tool 07/31/2020  I have been able to laugh and see the funny side of things. 0  I have looked forward with enjoyment to things. 0  I have blamed myself unnecessarily when things went wrong. 1  I have been anxious or worried for no good reason. 1  I have felt scared or panicky for no good reason. 0  Things have been getting on top of me. 1  I have been so unhappy that I have had difficulty sleeping. 0  I have felt sad or miserable. 0  I have been so unhappy that I have been crying. 1  The thought of harming myself has occurred to me. 0  Edinburgh Postnatal Depression Scale Total 4      After visit meds:  Allergies as of 08/05/2020   No Known Allergies     Medication List    STOP taking these medications   estradiol 2 MG tablet Commonly known as: ESTRACE   medroxyPROGESTERone 10 MG tablet Commonly known as: Provera     TAKE these medications   folic acid 1 MG tablet Commonly known as: FOLVITE Take 1 mg by mouth daily.   Fusion 65-65-25-30 MG Caps Take 1 capsule by mouth daily.   ibuprofen 800 MG tablet Commonly known as: ADVIL Take 1 tablet (800 mg total) by mouth every 8 (eight) hours as needed.   labetalol 100 MG tablet Commonly known as: NORMODYNE Take 100 mg by mouth 2 (two) times daily.   levothyroxine 175 MCG tablet Commonly known as: SYNTHROID Take 87.5-175 mcg by mouth See admin instructions. Take 175 mcg Monday -Saturday and take  87.5 mcg on Sunday   multivitamin-prenatal 27-0.8 MG Tabs tablet Take 1 tablet by mouth daily at 12 noon.   NIFEdipine 60 MG 24 hr tablet Commonly known as: ADALAT CC Take 1 tablet (60 mg total) by mouth daily.   oxyCODONE 5 MG immediate release tablet Commonly known as: Oxy IR/ROXICODONE Take 1 tablet (5 mg total) by mouth every 6 (six) hours as needed for moderate pain or severe pain.   senna-docusate 8.6-50 MG tablet Commonly known as: Senokot-S Take 2 tablets by mouth daily.             Discharge Care Instructions  (From admission, onward)         Start     Ordered   08/05/20 0000  Leave dressing on - Keep it clean, dry, and intact until clinic visit        02 /22/22 0824  Discharge home in stable condition Infant Feeding: Breast Infant Disposition:NICU Discharge instruction: per After Visit Summary and Postpartum booklet. Activity: Advance as tolerated. Pelvic rest for 6 weeks.  Diet: routine diet Anticipated Birth Control: Unsure Postpartum Appointment:6 weeks Additional Postpartum F/U: Incision check 1 week Follow up Visit:  Follow-up Information    Paula Compton, MD Follow up.   Specialty: Obstetrics and Gynecology Why: BP check in 5-7 days Contact information: [address]  [place] [address] [phone]                   08/05/2020 Deliah Boston, MD

## 2020-08-05 NOTE — Lactation Note (Signed)
This note was copied from a baby's chart. Lactation Consultation Note  Patient Name: [name] Date: 08/05/2020 Reason for consult: Follow-up assessment Age:37 days  As LC entered the room, mom sitting in the chair holding the baby feeding.  The NICU RN, baby released and was still showing signs of hunger and readiness to feed. LC offered assist and mom receptive.  LC showed mom how far onto the areola the baby's mouth should be to obtain a deep latch so the baby sustains the latch.  Baby latched x 2 - 5 mins each, and 3rd latch LC worked with mom to latch with the tea hold position and baby sustained the latch for 8 mins with swallows.  Baby became relaxed and non - nutritive and LC showed mom with a gloved finger how to release the suction.  Mom is being D/C today. LC reviewed engorgement prevention and tx . Supply and demand , importance of pumping 8-10 times day around the clock.  Per mom has been pumping by 3 hours and the most she has pumped at a session is 20 ml so far.    Maternal Data Has patient been taught Hand Expression?: Yes  Feeding LATCH Score Latch: Grasps breast easily, tongue down, lips flanged, rhythmical sucking. (mom was able to latch the baby using the tea hold with instruction. LC praised mom for her efforts.)  Audible Swallowing: A few with stimulation   Type of Nipple: Everted at rest and after stimulation  Comfort (Breast/Nipple): Filling, red/small blisters or bruises, mild/mod discomfort  Hold (Positioning): No assistance needed to correctly position infant at breast.  LATCH Score: 8   Lactation Tools Discussed/Used Tools: Pump (mom encouraged to pump after feeding) Flange Size: 24;27 Breast pump type: Double-Electric Breast Pump Pump Education: Milk Storage  Interventions Interventions: Breast feeding basics reviewed;Assisted with latch;Skin to skin;Breast massage;Breast compression;Position options;Support  pillows  Discharge Discharge Education: Engorgement and breast care Pump: Personal;DEBP, storage of breast milk,   Consult Status Consult Status: Follow-up Date: 08/06/20 Follow-up type: In-patient    [place] 08/05/2020, 9:48 AM

## 2020-08-09 ENCOUNTER — Ambulatory Visit: Payer: Self-pay

## 2020-08-09 NOTE — Lactation Note (Signed)
This note was copied from a baby's chart. Lactation Consultation Note  Patient Name: [name] WEXHB'Z Date: 08/09/2020 Reason for consult: Follow-up assessment;Early term 37-38.6wks Age:37 days   G1P2 mother seen today for a lactation follow-up. Upon entry into room mother was breast feeding, Hortencia Conradi. She informed that [name] has been feeding for about 40 minutes and was finishing up. She informed that the latch was comfortable with no current pains/discomfort. While speaking with mother few swallows were heard. Upon breaking the latch mother's nipple was round with no nipple injuries.   She expressed that she is pumping about q3h with 4h stretch at night and expressing ~ 59mls. She is currently using 14mm flanges as they are more comfortable for her. Mother also expressed that she does notice some nipple discomfort that she can attribute to pumping, not nursing. Informed mother that she can use EBM or coconut oil to assist with prevention/treatment of sore nipples. Also encouraged her to apply coconut oil to flanges prior to pumping to reduce friction.   She informs she is alternating nursing sessions between each infant (one bottle, one at breast). She states that she continues to pump after feeding the twins at the breast.   LC and [facility] student reviewed the importance of STS, benefits to breastmilk, pumping schedule and frequency, hand-free and hands on pumping. LC also made mother aware of outpatient lactation services and breastfeeding support groups.    Plan: - Continue STS as mush as possible - Encouraged to use breast compressions while nursing to increase transfer - Continue to pump q3h in a 24 hour period, with a 4 hour stretch during the night to ensure rest -  Feeding assessment schedued for 1pm tomorrow, 2/27    Maternal Data Does the patient have breastfeeding experience prior to this delivery?: No  Feeding Mother's Current Feeding Choice: Breast Milk and  Formula  LATCH Score Latch: Grasps breast easily, tongue down, lips flanged, rhythmical sucking.  Audible Swallowing: Spontaneous and intermittent  Type of Nipple: Everted at rest and after stimulation  Comfort (Breast/Nipple): Filling, red/small blisters or bruises, mild/mod discomfort  Hold (Positioning): No assistance needed to correctly position infant at breast.  LATCH Score: 9   Lactation Tools Discussed/Used Tools: Flanges Flange Size: 27 Breast pump type: Double-Electric Breast Pump Pump Education: Setup, frequency, and cleaning Reason for Pumping: Supplementation and sitmulation for milk supply, multiples Pumping frequency: q3h Pumped volume: 60 mL  Interventions Interventions: Breast feeding basics reviewed;Skin to skin;Breast compression;DEBP;Education;Coconut oil  Discharge    Consult Status Consult Status: Follow-up Date: 08/10/20    Lajuana Ripple 08/09/2020, 5:17 PM

## 2020-08-10 ENCOUNTER — Ambulatory Visit: Payer: Self-pay

## 2020-08-10 NOTE — Lactation Note (Signed)
This note was copied from a baby's chart. Lactation Consultation Note  Patient Name: Valor [place] GURKY'H Date: 08/10/2020 Reason for consult: Follow-up assessment;Primapara;1st time breastfeeding;Multiple gestation;Early term 37-37wks Age:35 years   LC in to assist with Mom of twins on day of discharge.  Both babies were circumcised this morning.   Baby [name] "B" being held by [facility].   Assisted with STS, baby showing subtle cues.  Assisted Mom to latch baby in football hold on right breast.  Baby able to latch deeply easily, but baby very sleepy and fell asleep on the breast.   Baby Price "A" sleeping currently.  Mom pumped last 3 hrs ago.  Talked to Mom about importance of supporting her milk supply with regular pumping.   Mom's plan is to alternate babies at the breast.  Every other feeding, babies would be bottle fed EBM, volume 40-50 ml.  Recommended that Mom see an IBCLC after discharge.  Mom has a doula that has a team of support including a Science writer that would make a home visit.  Plan- 1- Keep babies STS as much as possible 2- Offer breast with cues, supplement with EBM prn if baby too sleepy to breastfeed. 3-Pump both breasts 15-30 mins to support a full milk supply. 4- Call prn for concerns (brochure given and phone#s identified)    Feeding Nipple Type: Dr. Myra Gianotti Preemie  LATCH Score Latch: Too sleepy or reluctant, no latch achieved, no sucking elicited.  Audible Swallowing: None  Type of Nipple: Everted at rest and after stimulation  Comfort (Breast/Nipple): Soft / non-tender  Hold (Positioning): Assistance needed to correctly position infant at breast and maintain latch.  LATCH Score: 5   Lactation Tools Discussed/Used Tools: Pump  Interventions Interventions: Breast feeding basics reviewed;Assisted with latch;Skin to skin;Breast massage;Hand express;Breast compression;Adjust position;Support pillows;Position options;Expressed  milk;DEBP;Education  Discharge Discharge Education: Engorgement and breast care;Outpatient recommendation (Mom has a doula that is referring her to an IBCLC for home visit) Pump: Personal;DEBP (Medela DEBP) WIC Program: No  Consult Status Consult Status: Complete (mother declined follow up) Date: 08/10/20 Follow-up type: Call as needed    Broadus John 08/10/2020, 12:25 PM

## 2020-08-21 ENCOUNTER — Inpatient Hospital Stay (HOSPITAL_COMMUNITY): Admit: 2020-08-21 | Payer: Self-pay

## 2022-08-23 ENCOUNTER — Other Ambulatory Visit: Payer: Self-pay | Admitting: Orthopaedic Surgery

## 2022-08-23 ENCOUNTER — Ambulatory Visit
Admission: RE | Admit: 2022-08-23 | Discharge: 2022-08-23 | Disposition: A | Discharge status: Home / Self Care | Payer: Managed Care, Other (non HMO) | Source: Ambulatory Visit | Attending: Orthopaedic Surgery | Admitting: Orthopaedic Surgery

## 2022-08-23 ENCOUNTER — Ambulatory Visit
Admission: RE | Admit: 2022-08-23 | Discharge: 2022-08-23 | Disposition: A | Discharge status: Home / Self Care | Payer: Managed Care, Other (non HMO) | Source: Ambulatory Visit | Attending: Orthopaedic Surgery

## 2022-08-23 DIAGNOSIS — S42255A Nondisplaced fracture of greater tuberosity of left humerus, initial encounter for closed fracture: Secondary | ICD-10-CM

## 2022-10-26 ENCOUNTER — Other Ambulatory Visit: Payer: Self-pay

## 2022-10-26 DIAGNOSIS — R202 Paresthesia of skin: Secondary | ICD-10-CM

## 2022-11-01 ENCOUNTER — Ambulatory Visit (INDEPENDENT_AMBULATORY_CARE_PROVIDER_SITE_OTHER): Payer: Managed Care, Other (non HMO) | Admitting: Neurology

## 2022-11-01 DIAGNOSIS — R202 Paresthesia of skin: Secondary | ICD-10-CM

## 2022-11-01 DIAGNOSIS — S143XXA Injury of brachial plexus, initial encounter: Secondary | ICD-10-CM

## 2022-11-01 NOTE — Procedures (Signed)
[facility]  [address] [place], [address]  [place], [place] [address] Tel: [phone] Fax: [phone] Test Date:  11/01/2022  Patient: [name] DOB: [birth year] Physician: Jacquelyne Balint, MD  Sex: Female Height: 5\' 6"  Ref Phys: Ramond Marrow, MD  ID#: [id number]   Technician:    History: This is a 37 year old female with weakness of the left upper limb.  NCV & EMG Findings: Extensive electrodiagnostic evaluation of the left upper limb with additional nerve conduction studies of the right upper limb shows: Left lateral antebrachial cutaneous (LAC), left medial antebrachial cutaneous (MAC), and left dorsal ulnar cutaneous sensory responses are absent. Left ulnar sensory response shows reduced amplitude (6 V). Left median sensory response is within normal limits, but < 50% of amplitude compared to right median sensory response (left 21, right 46 V). Bilateral radial, right LAC, right MAC, and right ulnar sensory responses are within normal limits. Left median (APB) motor response shows reduced amplitude (3.8 mV). Left ulnar (ADM) motor response shows reduced amplitude (0.62 mV) and possible slowing of conduction velocity across the elbow (40 m/s). Active denervation changes with reduced recruitment but minimal chronic motor axon loss changes are seen in the left first dorsal interosseous and deltoid muscles. Reduced recruitment without active denervation or chronic motor axon loss changes are seen in the left extensor indicis proprius, flexor pollicis longus, and abductor pollicis brevis muscles.  Impression: This is a complex, abnormal study. The findings are most consistent with the following: Evidence of a subacute, patchy left brachial plexopathy, as evidenced by active denervation changes and reduced recruitment patterns, but lack of chronic neurogenic changes at this time. An overlapping left ulnar mononeuropathy is also possible. Could consider neuromuscular ultrasound or  neurologic consultation if clinically indicated. No definite electrodiagnostic evidence of a left cervical (C5-T1) motor radiculopathy. No electrodiagnostic evidence of a left or right median mononeuropathy at or distal to the wrist (ie: carpal tunnel syndrome).   ___________________________ Jacquelyne Balint, MD    Nerve Conduction Studies Motor Nerve Results    Latency Amplitude F-Lat Segment Distance CV Comment  Site (ms) Norm (mV) Norm (ms)  (cm) (m/s) Norm   Left Median (APB) Motor  Wrist 2.4  < 3.9 *3.8  > 6.0        Wrist (U) *NR - *NR -        Elbow 7.8 - 3.2 -  Elbow-Wrist 28 52  > 50   Elbow (U) *NR - *NR -        Right Median (APB) Motor  Wrist 2.4  < 3.9 6.7  > 6.0        Left Ulnar (ADM) Motor  Wrist 2.4  < 3.1 *0.62  > 7.0        Bel elbow 6.5 - 0.58 -  Bel elbow-Wrist 21 51  > 50   Ab elbow 9.0 - 0.36 -  Ab elbow-Bel elbow 10 40 -   Right Ulnar (ADM) Motor  Wrist 2.1  < 3.1 16.3  > 7.0         Sensory Sites    Neg Peak Lat Amplitude (O-P) Segment Distance Velocity Comment  Site (ms) Norm (V) Norm  (cm) (ms)   Left Dorsal Ulnar Cutaneous Sensory  Wrist-Dorsum 5th MC *NR  < 3.1 *NR  > 12 Wrist-Dorsum 5th MC 8    Left Lateral Antebrachial Cutaneous Sensory  Lat biceps-Lat forearm *NR  < 2.9 *NR  > 14 Lat biceps-Lat forearm 12  Right Lateral Antebrachial Cutaneous Sensory  Lat biceps-Lat forearm 2.3  < 2.9 15  > 14 Lat biceps-Lat forearm 12    Left Medial Antebrachial Cutaneous Sensory  Elbow-Med forearm *NR  < 3.2 *NR  > 5 Elbow-Med forearm 12    Right Medial Antebrachial Cutaneous Sensory  Elbow-Med forearm 2.0  < 3.2 7  > 5 Elbow-Med forearm 12    Left Median Sensory  Wrist-Dig II 3.1  < 3.4 21  > 20 Wrist-Dig II 13    Right Median Sensory  Wrist-Dig II 3.0  < 3.4 46  > 20 Wrist-Dig II 13    Left Radial Sensory  Forearm-Wrist 2.1  < 2.7 31  > 18 Forearm-Wrist 10    Right Radial Sensory  Forearm-Wrist 1.98  < 2.7 36  > 18 Forearm-Wrist 10    Left Ulnar  Sensory  Wrist-Dig V 3.0  < 3.1 *6  > 12 Wrist-Dig V 11    Right Ulnar Sensory  Wrist-Dig V 2.5  < 3.1 49  > 12 Wrist-Dig V 11     Electromyography   Side Muscle Ins.Act Fibs Fasc Recrt Amp Dur Poly Activation Comment  Left FDI Nml *2+ Nml *SMU Nml Nml *1+ Nml N/A  Left EIP Nml Nml Nml *1- Nml Nml Nml Nml N/A  Left FPL *1+ Nml Nml *1- Nml Nml *1+ Nml N/A  Left APB Nml Nml Nml *2- Nml Nml *1+ Nml N/A  Left Pronator teres Nml Nml Nml Nml Nml Nml Nml Nml N/A  Left Biceps Nml Nml Nml Nml Nml Nml Nml Nml N/A  Left Triceps Nml Nml Nml Nml Nml Nml Nml Nml N/A  Left Deltoid Nml *1+ Nml *2- Nml Nml *1+ Nml N/A  Left C7 PSP Nml Nml Nml Nml Nml Nml Nml Nml N/A      Waveforms:  Motor           Sensory

## 2023-07-05 NOTE — Therapy (Signed)
OUTPATIENT OCCUPATIONAL THERAPY ORTHO EVALUATION  Patient Name: [name] MRN: [id number] DOB:[birth year], 38 y.o., female Today's Date: 07/07/2023  PCP: Karlyne Greenspan REFERRING PROVIDER: Alfonse Alpers, PA-C  END OF SESSION:  OT End of Session - 07/07/23 0759     Visit Number 1    Number of Visits 10    Date for OT Re-Evaluation 09/16/23    Authorization Type [employer]    OT Start Time 0800    OT Stop Time 0851    OT Time Calculation (min) 51 min    Equipment Utilized During Treatment rubber bands    Activity Tolerance Patient tolerated treatment well;Patient limited by fatigue;No increased pain    Behavior During Therapy WFL for tasks assessed/performed             Past Medical History:  Diagnosis Date   GERD (gastroesophageal reflux disease)    occasional , no meds    History of 2019 novel coronavirus disease (COVID-19)    tested positive 04-12-2019 (results in care everywhere);  (10-04-2019 per pt symptoms where aches, head cold, fatigue, loss taste/ smell;  states symptoms resolved in 2 weeks)   History of thyroid cancer (10-04-2019 per pt lov 07/ 2020, no recurrence   endocrinologist--- Dr Maisie Fus in [place]  (dx 01/ 2010)  s/p  thyroidectomy left 01/ 2010 and right 02/ 2010;  post RAI 03/ 2010     Hypertension    followed by pcp   (10-04-2019 per pt never had a stress test)   Hypothyroidism, postsurgical endocrinologist--- Dr Maisie Fus in [place]   s/p total thyroidectomy 2010 for cancer   Infertility, female    PCOS (polycystic ovarian syndrome)    PONV (postoperative nausea and vomiting)    Uterine fibroid    Past Surgical History:  Procedure Laterality Date   CESAREAN SECTION MULTI-GESTATIONAL N/A 07/31/2020   Procedure: CESAREAN SECTION MULTI-GESTATIONAL;  Surgeon: Huel Cote, MD;  Location: [facility];  Service: Obstetrics;  Laterality: N/A;  RNFA if one by then   HYSTEROSCOPY WITH RESECTOSCOPE N/A 10/10/2019   Procedure: HYSTEROSCOPY,  BIPOLAR RESECTION of SUBMUCOSAL MYOMAS;  Surgeon: Fermin Schwab, MD;  Location: [facility];  Service: Gynecology;  Laterality: N/A;   LAPAROSCOPIC GELPORT ASSISTED MYOMECTOMY  05-01-2019   @[facility]   dr Lenell Antu ADHESIONS/ MYOMECTOMY/ EXCISION AND ABLATION ENDOMETRIOSIS/ CHROMOTUBATION   OVUM / OOCYTE RETRIEVAL  03/2019   THYROID LOBECTOMY Bilateral left 01/ 2010;  right 02/ 2010   Patient Active Problem List   Diagnosis Date Noted   S/P primary low transverse C-section 07/31/2020    ONSET DATE: DOS Oct 2024   REFERRING DIAG: 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  She states having Lt arm ulnar nerve weakness after fall, Lt shoulder fx. She states she fell March 2024 in [place] and broke her Lt shoulder, had weakness, paresthesias, was dx with brachial plexus injury, then had UN transposition in Oct ~2-3 months ago. Works in [employer] for an [employer].    PERTINENT HISTORY:  EMG from 11/01/22 showed: "Impression: This is a complex, abnormal study. The findings are most consistent with the following: Evidence of a subacute, patchy left brachial plexopathy, as evidenced by active denervation changes and reduced recruitment patterns, but lack of chronic neurogenic changes  at this time. An overlapping left ulnar mononeuropathy is also possible. Could consider neuromuscular ultrasound or neurologic consultation if clinically indicated. No definite electrodiagnostic evidence of a left cervical (C5-T1) motor radiculopathy. No electrodiagnostic evidence of a left or right median mononeuropathy at or distal to the wrist (ie: carpal tunnel syndrome)."  PRECAUTIONS: None; RED FLAGS: None   WEIGHT BEARING RESTRICTIONS: No  PAIN:   Are you having pain? Yes: NPRS scale: no pain at rest but rates tingling/numbness 6/10 now Pain location: Lt hand, ulnar side Pain description: tingling, numb Aggravating factors: sleep Relieving factors: raising hand  FALLS: Has patient fallen in last 6 months? No  LIVING ENVIRONMENT: Lives with: lives with their family Lives in: House/apartment Has following equipment at home: None  PLOF: Independent  PATIENT GOALS: To improve use of left nondominant hand and arm and prevent further atrophy    OBJECTIVE: (All objective assessments below are from initial evaluation on: 07/07/23 unless otherwise specified.)   HAND DOMINANCE: Right   ADLs: Overall ADLs: States decreased ability to grab, hold household objects, pain and difficulty to open containers, perform FMS tasks (manipulate fasteners on clothing)   FUNCTIONAL OUTCOME MEASURES: Eval: Quick DASH 34% impairment today  (Higher % Score  =  More Impairment)    UPPER EXTREMITY ROM     Shoulder to Wrist AROM Right eval Left eval  Shoulder flexion  142  Shoulder abduction  131  Shoulder extension    Shoulder internal rotation  37  Shoulder external rotation  69  Elbow flexion  full  Elbow extension  full  Forearm supination  84  Forearm pronation   90  Wrist flexion 84 75  Wrist extension 71 65  (Blank rows = not tested)   Hand AROM Right eval Left eval  Full Fist Ability (or Gap to Distal Palmar Crease) full Full fist  Thumb Opposition  (Kapandji Scale)  10/10 8/10  (Blank rows = not tested)   UPPER EXTREMITY MMT:     MMT Left Eval  Finger adduction 4-/5  Finger abduction 3-/5  Elbow flexion   Elbow extension   Forearm supination   Forearm pronation   Wrist flexion 5/5  Wrist extension 5/5  Wrist ulnar deviation   Wrist radial deviation   (Blank rows = not tested)  HAND FUNCTION: Eval: Observed weakness in affected Lt hand.  Grip strength Right: 66 lbs, Left: 46 lbs  Rt lat pinch: 17#; Lt lat  pinch: 5#  COORDINATION: Eval: Observed coordination impairments with affected left hand.  Details will be tested next session 9 Hole Peg Test (modified for use of digits 4 and 5) Right: TBD sec, Left: TBD sec   SENSATION: Eval:  Light touch is somewhat diminished in Lt hand ulnar nerve, though deep pressure is intact, temp sense intact.   Static 2pt discrimination  Rt SF 3.37mm, Rt IF 3mm;   Lt SF 6mm, Lt IF 4.5  EDEMA:   Eval: None  COGNITION: Eval: Overall cognitive status: WFL for evaluation today   OBSERVATIONS:   Eval: Nerve testing for left arm: Positive Froment's sign in Lt hand, neg Wartenburg's sign in Left, obvious wasting in adductor policis, obvious intrinsic weakness   Fortunately no clawing that is significant   TODAY'S TREATMENT:  Post-evaluation treatment:   She was given education on neuromuscular reeducation including anatomy, prognosis and time frames, avoidance of compression on nerves, adapting sleep postures, avoiding clawed hand positions.  She was also educated on ulnar nerve gliding for neuromuscular  reeducation to help with paresthesia and any nerve entrapments.  As she had some stiffness through her shoulder and wrist, she was given stretches to help with any tightness or nerve entrapments in those areas as well as a therapy putty activity program to perform at least 3 times a day to strengthen the muscles that have been in atrophy.  All of these exercises and activities are listed below and each one was quickly explained and demonstrated to her with her stating understanding.  These will need reviewed in upcoming sessions next week.   Exercises - Ulnar Nerve Flossing  - 3-4 x daily - 1-2 sets - 5-10 reps - Seated Scapular Retraction  - 2-3 x daily - 3-5 reps - 3 sec hold - Sleeper Stretch  - 3-4 x daily - 5 reps - 15-20 sec hold - Doorway Pec Stretch at 90 Degrees Abduction  - 4-6 x daily - 1 sets - 10-15 reps - Standing Shoulder Abduction Slides at  Wall  - 2-3 x daily - 3-5 reps - 15 sec hold - Wrist Prayer Stretch  - 4 x daily - 3-5 reps - 15 sec hold - Full Fist  - 2-3 x daily - 5 reps - "Duck Mouth" Strength  - 2-3 x daily - 5 reps - Finger Key Grip with Putty  - 2-3 x daily - 5 reps - Cutting Putty  - 2-3 x daily - 5 reps - Finger Spread  - 2-3 x daily - 5 reps   PATIENT EDUCATION: Education details: See tx section above for details  Person educated: Patient Education method: Verbal Instruction, Teach back, Handouts  Education comprehension: States and demonstrates understanding, Additional Education required    HOME EXERCISE PROGRAM: Access Code: [email or web address] URL: [email or web address] Date: 07/07/2023 Prepared by: Fannie Knee   GOALS: Goals reviewed with patient? Yes   SHORT TERM GOALS: (STG required if POC>30 days) Target Date: 07/22/2023  Pt will obtain protective, custom orthotic. Goal status: TBD/PRN  2.  Pt will demo/state understanding of initial HEP to improve pain levels and prerequisite motion. Goal status: INITIAL   LONG TERM GOALS: Target Date: 09/16/2023  Pt will improve functional ability by decreased impairment per Quick DASH assessment from 34% to 15% or better, for better quality of life. Goal status: INITIAL  2.  Pt will improve lateral pinch strength in left hand from 5 pounds to at least 10 pounds for functional use at home and in IADLs. Goal status: INITIAL  3.  Pt will improve A/ROM in left wrist extension from 65 degrees to at least 72 degrees, to have functional motion for tasks like reach and grasp.  Goal status: INITIAL  4.  Pt will improve strength in left hand interossei from 3 -/5 MMT to at least 4/5 MMT to have increased functional ability with left hand. Goal status: INITIAL  5.  Pt will improve coordination skills in left hand, as seen by significantly improved score on modified nine-hole peg testing to have increased functional ability to carry out fine motor  tasks (fasteners, etc.) and more complex, coordinated IADLs (meal prep, sports, etc.).  Goal status: INITIAL  6.  Pt will decrease tingling at rest from 6/10 to 3/10 or better to have better sleep and occupational participation in daily roles. Goal status: INITIAL    ASSESSMENT:  CLINICAL IMPRESSION: Patient is a 38 y.o. female who was seen today for occupational therapy evaluation for left hand and arm weakness, paresthesia, decreased functional ability and atrophy in muscle  groups mainly in the ulnar nerve distribution.  She will benefit from outpatient occupational therapy to increase strength and coordination with the left hand and hopefully paresthesia symptoms.  It was explained that this could be a very long process as nerves are slow to heal and this is now a chronic issue for her.  PERFORMANCE DEFICITS: in functional skills including ADLs, IADLs, coordination, dexterity, ROM, strength, fascial restrictions, muscle spasms, flexibility, Fine motor control, body mechanics, endurance, decreased knowledge of precautions, and UE functional use, cognitive skills including problem solving, and psychosocial skills including environmental adaptation and habits.   IMPAIRMENTS: are limiting patient from ADLs, IADLs, work, and leisure.   COMORBIDITIES: may have co-morbidities  that affects occupational performance. Patient will benefit from skilled OT to address above impairments and improve overall function.  MODIFICATION OR ASSISTANCE TO COMPLETE EVALUATION: No modification of tasks or assist necessary to complete an evaluation.  OT OCCUPATIONAL PROFILE AND HISTORY: Problem focused assessment: Including review of records relating to presenting problem.  CLINICAL DECISION MAKING: Moderate - several treatment options, min-mod task modification necessary  REHAB POTENTIAL: Good  EVALUATION COMPLEXITY: Low      PLAN:  OT FREQUENCY: 1-2x/week  OT DURATION: 10 weeks up to 09/16/2023 and 10  total visits as needed (anticipate taking weeks off for her personal management and returning for status checks)  PLANNED INTERVENTIONS: 16109 OT Re-evaluation, 97535 self care/ADL training, 60454 therapeutic exercise, 97530 therapeutic activity, 97112 neuromuscular re-education, 97140 manual therapy, 97035 ultrasound, 97010 moist heat, 97010 cryotherapy, 97032 electrical stimulation (manual), 97014 electrical stimulation unattended, 97760 Orthotics management and training, 09811 Splinting (initial encounter), [id number] Subsequent splinting/medication, scar mobilization, compression bandaging, Dry needling, coping strategies training, and patient/family education  RECOMMENDED OTHER SERVICES: None now   CONSULTED AND AGREED WITH PLAN OF CARE: Patient  PLAN FOR NEXT SESSION:   Review initial home exercises and recommendations as well as take modified nine-hole peg test, perform manual therapy and modalities as helpful   Fannie Knee, OTR/L, CHT 07/07/2023, 10:38 AM

## 2023-07-07 ENCOUNTER — Encounter: Payer: Self-pay | Admitting: Rehabilitative and Restorative Service Providers"

## 2023-07-07 ENCOUNTER — Ambulatory Visit (INDEPENDENT_AMBULATORY_CARE_PROVIDER_SITE_OTHER): Payer: Managed Care, Other (non HMO) | Admitting: Rehabilitative and Restorative Service Providers"

## 2023-07-07 DIAGNOSIS — R278 Other lack of coordination: Secondary | ICD-10-CM

## 2023-07-07 DIAGNOSIS — M6281 Muscle weakness (generalized): Secondary | ICD-10-CM | POA: Diagnosis not present

## 2023-07-07 DIAGNOSIS — R202 Paresthesia of skin: Secondary | ICD-10-CM

## 2023-07-11 NOTE — Therapy (Incomplete)
OUTPATIENT OCCUPATIONAL THERAPY  TREATMENT NOTE  Patient Name: [name] MRN: [id number] DOB:July 27, 1985, 38 y.o., female Today's Date: 07/11/2023  PCP: Scifres, Peter Minium REFERRING PROVIDER: Alfonse Alpers, PA-C  END OF SESSION:    Past Medical History:  Diagnosis Date   GERD (gastroesophageal reflux disease)    occasional , no meds    History of 2019 novel coronavirus disease (COVID-19)    tested positive 04-12-2019 (results in care everywhere);  (10-04-2019 per pt symptoms where aches, head cold, fatigue, loss taste/ smell;  states symptoms resolved in 2 weeks)   History of thyroid cancer (10-04-2019 per pt lov 07/ 2020, no recurrence   endocrinologist--- Dr Maisie Fus in [place]  (dx 01/ 2010)  s/p  thyroidectomy left 01/ 2010 and right 02/ 2010;  post RAI 03/ 2010     Hypertension    followed by pcp   (10-04-2019 per pt never had a stress test)   Hypothyroidism, postsurgical endocrinologist--- Dr Maisie Fus in [place]   s/p total thyroidectomy 2010 for cancer   Infertility, female    PCOS (polycystic ovarian syndrome)    PONV (postoperative nausea and vomiting)    Uterine fibroid    Past Surgical History:  Procedure Laterality Date   CESAREAN SECTION MULTI-GESTATIONAL N/A 07/31/2020   Procedure: CESAREAN SECTION MULTI-GESTATIONAL;  Surgeon: Huel Cote, MD;  Location: [facility];  Service: Obstetrics;  Laterality: N/A;  RNFA if one by then   HYSTEROSCOPY WITH RESECTOSCOPE N/A 10/10/2019   Procedure: HYSTEROSCOPY, BIPOLAR RESECTION of SUBMUCOSAL MYOMAS;  Surgeon: Fermin Schwab, MD;  Location: [facility];  Service: Gynecology;  Laterality: N/A;   LAPAROSCOPIC GELPORT ASSISTED MYOMECTOMY  05-01-2019   @[facility]   dr Lenell Antu ADHESIONS/ MYOMECTOMY/ EXCISION AND ABLATION ENDOMETRIOSIS/ CHROMOTUBATION   OVUM / OOCYTE RETRIEVAL  03/2019   THYROID LOBECTOMY Bilateral left 01/ 2010;  right 02/ 2010   Patient Active Problem List   Diagnosis Date  Noted   S/P primary low transverse C-section 07/31/2020    ONSET DATE: DOS Oct 2024   REFERRING DIAG: 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  Rationale for Evaluation and Treatment: Rehabilitation   PERTINENT HISTORY:  EMG from 11/01/22 showed: "Impression: This is a complex, abnormal study. The findings are most consistent with the following: Evidence of a subacute, patchy left brachial plexopathy, as evidenced by active denervation changes and reduced recruitment patterns, but lack of chronic neurogenic changes at this time. An overlapping left ulnar mononeuropathy is also possible. Could consider neuromuscular ultrasound or neurologic consultation if clinically indicated. No definite electrodiagnostic evidence of a left cervical (C5-T1) motor radiculopathy. No electrodiagnostic evidence of a left or right median mononeuropathy at or distal to the wrist (ie: carpal tunnel syndrome)."  She is now ~3 months post-op.  She states having Lt arm ulnar nerve weakness after fall, Lt shoulder fx. She states she fell March 2024 in [place] and broke her Lt shoulder, had weakness, paresthesias, was dx with brachial plexus injury, then had UN transposition in Oct ~2-3 months ago. Works in [employer] for an [employer].   PRECAUTIONS: None; RED FLAGS: None   WEIGHT BEARING RESTRICTIONS: No   SUBJECTIVE:   SUBJECTIVE STATEMENT: She states ***.     PAIN:  Are you having pain? Yes: NPRS scale: no pain at rest but rates tingling/numbness 6/10 now *** Pain location: Lt hand, ulnar side Pain description: tingling, numb Aggravating factors: sleep Relieving factors: raising hand  FALLS: Has patient fallen in last 6 months? No  LIVING ENVIRONMENT: Lives with: lives with their family Lives in: House/apartment Has following equipment at home: None  PLOF: Independent  PATIENT GOALS: To improve use of left nondominant hand and arm and prevent  further atrophy    OBJECTIVE: (All objective assessments below are from initial evaluation on: 07/07/23 unless otherwise specified.)   HAND DOMINANCE: Right   ADLs: Overall ADLs: States decreased ability to grab, hold household objects, pain and difficulty to open containers, perform FMS tasks (manipulate fasteners on clothing)   FUNCTIONAL OUTCOME MEASURES: Eval: Quick DASH 34% impairment today  (Higher % Score  =  More Impairment)    UPPER EXTREMITY ROM     Shoulder to Wrist AROM Right eval Left eval  Shoulder flexion  142  Shoulder abduction  131  Shoulder extension    Shoulder internal rotation  37  Shoulder external rotation  69  Elbow flexion  full  Elbow extension  full  Forearm supination  84  Forearm pronation   90  Wrist flexion 84 75  Wrist extension 71 65  (Blank rows = not tested)   Hand AROM Right eval Left eval  Full Fist Ability (or Gap to Distal Palmar Crease) full Full fist  Thumb Opposition  (Kapandji Scale)  10/10 8/10  (Blank rows = not tested)   UPPER EXTREMITY MMT:     MMT Left Eval  Finger adduction 4-/5  Finger abduction 3-/5  Elbow flexion   Elbow extension   Forearm supination   Forearm pronation   Wrist flexion 5/5  Wrist extension 5/5  Wrist ulnar deviation   Wrist radial deviation   (Blank rows = not tested)  HAND FUNCTION: Eval: Observed weakness in affected Lt hand.  Grip strength Right: 66 lbs, Left: 46 lbs  Rt lat pinch: 17#; Lt lat pinch: 5#  COORDINATION: Eval: Observed coordination impairments with affected left hand.  Details will be tested next session 9 Hole Peg Test (modified for use of digits 4 and 5) Right: TBD sec, Left: TBD sec   SENSATION: Eval:  Light touch is somewhat diminished in Lt hand ulnar nerve, though deep pressure is intact, temp sense intact.   Static 2pt discrimination  Rt SF 3.52mm, Rt IF 3mm;   Lt SF 6mm, Lt IF 4.5   OBSERVATIONS:   Eval: Nerve testing for left arm: Positive Froment's  sign in Lt hand, neg Wartenburg's sign in Left, obvious wasting in adductor policis, obvious intrinsic weakness   Fortunately no clawing that is significant   TODAY'S TREATMENT:  07/14/23: *** Review initial home exercises and recommendations as well as take modified nine-hole peg test, perform manual therapy and modalities as helpful   Exercises - Ulnar Nerve Flossing  - 3-4 x daily - 1-2 sets - 5-10 reps - Seated Scapular Retraction  - 2-3 x daily - 3-5 reps - 3 sec hold - Sleeper Stretch  - 3-4 x daily - 5 reps - 15-20 sec hold - Doorway Pec Stretch at 90 Degrees Abduction  - 4-6 x daily - 1 sets - 10-15 reps - Standing Shoulder Abduction Slides at Wall  - 2-3 x daily - 3-5 reps - 15 sec hold - Wrist Prayer Stretch  - 4 x daily - 3-5 reps - 15 sec hold - Full Fist  - 2-3 x daily - 5 reps - "Duck Mouth" Strength  - 2-3 x daily - 5 reps - Finger Key Grip with Putty  -  2-3 x daily - 5 reps - Cutting Putty  - 2-3 x daily - 5 reps - Finger Spread  - 2-3 x daily - 5 reps   PATIENT EDUCATION: Education details: See tx section above for details  Person educated: Patient Education method: Verbal Instruction, Teach back, Handouts  Education comprehension: States and demonstrates understanding, Additional Education required    HOME EXERCISE PROGRAM: Access Code: [email or web address] URL: [email or web address] Date: 07/07/2023 Prepared by: Fannie Knee   GOALS: Goals reviewed with patient? Yes   SHORT TERM GOALS: (STG required if POC>30 days) Target Date: 07/22/2023  Pt will obtain protective, custom orthotic. Goal status: TBD/PRN  2.  Pt will demo/state understanding of initial HEP to improve pain levels and prerequisite motion. Goal status: INITIAL   LONG TERM GOALS: Target Date: 09/16/2023  Pt will improve functional ability by decreased impairment per Quick DASH assessment from 34% to 15% or better, for better quality of life. Goal status: INITIAL  2.  Pt will  improve lateral pinch strength in left hand from 5 pounds to at least 10 pounds for functional use at home and in IADLs. Goal status: INITIAL  3.  Pt will improve A/ROM in left wrist extension from 65 degrees to at least 72 degrees, to have functional motion for tasks like reach and grasp.  Goal status: INITIAL  4.  Pt will improve strength in left hand interossei from 3 -/5 MMT to at least 4/5 MMT to have increased functional ability with left hand. Goal status: INITIAL  5.  Pt will improve coordination skills in left hand, as seen by significantly improved score on modified nine-hole peg testing to have increased functional ability to carry out fine motor tasks (fasteners, etc.) and more complex, coordinated IADLs (meal prep, sports, etc.).  Goal status: INITIAL  6.  Pt will decrease tingling at rest from 6/10 to 3/10 or better to have better sleep and occupational participation in daily roles. Goal status: INITIAL    ASSESSMENT:  CLINICAL IMPRESSION: 07/14/23: ***    PLAN:  OT FREQUENCY: 1-2x/week  OT DURATION: 10 weeks up to 09/16/2023 and 10 total visits as needed (anticipate taking weeks off for her personal management and returning for status checks)  PLANNED INTERVENTIONS: 97168 OT Re-evaluation, 97535 self care/ADL training, 78295 therapeutic exercise, 97530 therapeutic activity, 97112 neuromuscular re-education, 97140 manual therapy, 97035 ultrasound, 97010 moist heat, 97010 cryotherapy, 97032 electrical stimulation (manual), 97014 electrical stimulation unattended, 97760 Orthotics management and training, 62130 Splinting (initial encounter), [id number] Subsequent splinting/medication, scar mobilization, compression bandaging, Dry needling, coping strategies training, and patient/family education  CONSULTED AND AGREED WITH PLAN OF CARE: Patient  PLAN FOR NEXT SESSION:   ***   Fannie Knee, OTR/L, CHT 07/11/2023, 7:40 PM

## 2023-07-14 ENCOUNTER — Encounter: Payer: Self-pay | Admitting: Rehabilitative and Restorative Service Providers"

## 2023-07-20 ENCOUNTER — Encounter: Payer: Managed Care, Other (non HMO) | Admitting: Rehabilitative and Restorative Service Providers"

## 2023-07-25 NOTE — Therapy (Signed)
OUTPATIENT OCCUPATIONAL THERAPY  TREATMENT NOTE  Patient Name: [name] MRN: [id number] DOB:[birth year], 38 y.o., female Today's Date: 07/27/2023  PCP: Karlyne Greenspan REFERRING PROVIDER: Alfonse Alpers, PA-C  END OF SESSION:  OT End of Session - 07/27/23 0849     Visit Number 2    Number of Visits 10    Date for OT Re-Evaluation 09/16/23    Authorization Type [employer]    OT Start Time [phone]    OT Stop Time 0920    OT Time Calculation (min) 31 min    Activity Tolerance Patient tolerated treatment well;Patient limited by fatigue;No increased pain    Behavior During Therapy WFL for tasks assessed/performed              Past Medical History:  Diagnosis Date   GERD (gastroesophageal reflux disease)    occasional , no meds    History of 2019 novel coronavirus disease (COVID-19)    tested positive 04-12-2019 (results in care everywhere);  (10-04-2019 per pt symptoms where aches, head cold, fatigue, loss taste/ smell;  states symptoms resolved in 2 weeks)   History of thyroid cancer (10-04-2019 per pt lov 07/ 2020, no recurrence   endocrinologist--- Dr Maisie Fus in [place]  (dx 01/ 2010)  s/p  thyroidectomy left 01/ 2010 and right 02/ 2010;  post RAI 03/ 2010     Hypertension    followed by pcp   (10-04-2019 per pt never had a stress test)   Hypothyroidism, postsurgical endocrinologist--- Dr Maisie Fus in [place]   s/p total thyroidectomy 2010 for cancer   Infertility, female    PCOS (polycystic ovarian syndrome)    PONV (postoperative nausea and vomiting)    Uterine fibroid    Past Surgical History:  Procedure Laterality Date   CESAREAN SECTION MULTI-GESTATIONAL N/A 07/31/2020   Procedure: CESAREAN SECTION MULTI-GESTATIONAL;  Surgeon: Huel Cote, MD;  Location: [facility];  Service: Obstetrics;  Laterality: N/A;  RNFA if one by then   HYSTEROSCOPY WITH RESECTOSCOPE N/A 10/10/2019   Procedure: HYSTEROSCOPY, BIPOLAR RESECTION of SUBMUCOSAL MYOMAS;  Surgeon:  Fermin Schwab, MD;  Location: [facility];  Service: Gynecology;  Laterality: N/A;   LAPAROSCOPIC GELPORT ASSISTED MYOMECTOMY  05-01-2019   @[facility]   dr Lenell Antu ADHESIONS/ MYOMECTOMY/ EXCISION AND ABLATION ENDOMETRIOSIS/ CHROMOTUBATION   OVUM / OOCYTE RETRIEVAL  03/2019   THYROID LOBECTOMY Bilateral left 01/ 2010;  right 02/ 2010   Patient Active Problem List   Diagnosis Date Noted   S/P primary low transverse C-section 07/31/2020    ONSET DATE: DOS Oct 2024   REFERRING DIAG: 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 The findings are most consistent with the following: Evidence of a subacute, patchy left brachial plexopathy, as evidenced by active denervation changes and reduced recruitment patterns, but lack of chronic neurogenic changes at this time. An overlapping left ulnar mononeuropathy is also possible. Could consider neuromuscular ultrasound or neurologic consultation if clinically indicated. No definite electrodiagnostic evidence of a left cervical (C5-T1) motor radiculopathy. No electrodiagnostic evidence of a left or right median mononeuropathy at or distal to the wrist (ie: carpal tunnel syndrome)."  She is now ~3 months post-op.  She states having Lt arm ulnar nerve weakness after fall, Lt shoulder  fx. She states she fell March 2024 in [place] and broke her Lt shoulder, had weakness, paresthesias, was dx with brachial plexus injury, then had UN transposition in Oct ~2-3 months ago. Works in [employer] for an [employer].   PRECAUTIONS: None; RED FLAGS: None   WEIGHT BEARING RESTRICTIONS: No   SUBJECTIVE:   SUBJECTIVE STATEMENT: She states her elbow area now has some tingling  sensations, her thumb seems to be working more.  Still rates tingling worse in digits 4-5. She states using keys is better, but buttons are still difficult.     PAIN:  Are you having pain? Yes: NPRS scale: no pain at rest but rates tingling/numbness 6-7/10 now  Pain location: Lt hand, ulnar side Pain description: tingling, numb Aggravating factors: sleep Relieving factors: raising hand  FALLS: Has patient fallen in last 6 months? No  LIVING ENVIRONMENT: Lives with: lives with their family Lives in: House/apartment Has following equipment at home: None  PLOF: Independent  PATIENT GOALS: To improve use of left nondominant hand and arm and prevent further atrophy    OBJECTIVE: (All objective assessments below are from initial evaluation on: 07/07/23 unless otherwise specified.)   HAND DOMINANCE: Right   ADLs: Overall ADLs: States decreased ability to grab, hold household objects, pain and difficulty to open containers, perform FMS tasks (manipulate fasteners on clothing)   FUNCTIONAL OUTCOME MEASURES: Eval: Quick DASH 34% impairment today  (Higher % Score  =  More Impairment)    UPPER EXTREMITY ROM     Shoulder to Wrist AROM Right eval Left eval Lt 07/27/23  Shoulder flexion  142   Shoulder abduction  131 131  Shoulder extension     Shoulder internal rotation  37 27  Shoulder external rotation  69 77  Elbow flexion  full   Elbow extension  full   Forearm supination  84 81  Forearm pronation   90   Wrist flexion 84 75 72  Wrist extension 71 65 64  (Blank rows = not tested)   Hand AROM Right eval Left eval Lt 07/27/23  Full Fist Ability (or Gap to Distal Palmar Crease) full Full fist full  Thumb Opposition  (Kapandji Scale)  10/10 8/10 9/10  (Blank rows = not tested)   UPPER EXTREMITY MMT:     MMT Left Eval Lt 07/27/23  Finger adduction 4-/5 4-/5  Finger abduction 3-/5 3/5  Elbow flexion    Elbow extension    Forearm supination    Forearm pronation     Wrist flexion 5/5   Wrist extension 5/5   Wrist ulnar deviation    Wrist radial deviation    (Blank rows = not tested)  HAND FUNCTION: 07/27/23: Grip strength Left: 53 lbs  Lt lat pinch: 8#  Eval: Observed weakness in affected Lt hand.  Grip strength Right: 66 lbs, Left: 46 lbs  Rt lat pinch: 17#; Lt lat pinch: 5#  COORDINATION: 07/27/23: Modified 9HPT: 35.8sec Lt; 30 sec Rt  Eval: Observed coordination impairments with affected left hand.  Details will be tested next session 9 Hole Peg Test (modified for use of digits 4 and 5) Right: TBD sec, Left: TBD sec   SENSATION: 07/27/23: S2PD Test Lt SF: 4mm; Lt IF: 3 mm  Eval:  Light touch is somewhat diminished in Lt hand ulnar nerve, though deep pressure is intact, temp sense intact.  Static 2pt discrimination  Rt SF 3.11mm, Rt IF 3mm;   Lt SF 6mm, Lt IF 4.5   OBSERVATIONS:  Eval: Nerve testing for left arm: Positive Froment's sign in Lt hand, neg Wartenburg's sign in Left, obvious wasting in adductor policis, obvious intrinsic weakness   Fortunately no clawing that is significant   TODAY'S TREATMENT:  07/27/23: She starts with active range of motion for exercise as well as new measures which unfortunately does not show much progress today, but she states not working on this very much.  We did review her full home exercise program including nerve gliding and therapy putty activities and she was given several upgraded activities as bolded below.  She states understanding these new activities and can continue to self manage this HEP for 2 to 3 weeks before returning.    Exercises - Ulnar Nerve Flossing  - 3-4 x daily - 1-2 sets - 5-10 reps - Seated Scapular Retraction  - 2-3 x daily - 3-5 reps - 3 sec hold - Sleeper Stretch  - 3-4 x daily - 5 reps - 15-20 sec hold - Doorway Pec Stretch at 90 Degrees Abduction  - 4-6 x daily - 1 sets - 10-15 reps - Standing Shoulder Abduction Slides at Wall  - 2-3 x daily - 3-5 reps - 15 sec hold -  Wrist Prayer Stretch  - 4 x daily - 3-5 reps - 15 sec hold - Full Fist  - 2-3 x daily - 5 reps - "Duck Mouth" Strength  - 2-3 x daily - 5 reps - Finger Key Grip with Putty  - 2-3 x daily - 5 reps - Cutting Putty  - 2-3 x daily - 5 reps - Finger Spread  - 2-3 x daily - 5 reps - Standing neck/upper traps stretch  - 4-6 x daily - 3-5 reps - 15 sec hold - Fridge Door Stretch  - 4 x daily - 3-5 reps - 15 sec hold - Finger Key Grip with Putty  - 2-3 x daily - 5 reps    PATIENT EDUCATION: Education details: See tx section above for details  Person educated: Patient Education method: Verbal Instruction, Teach back, Handouts  Education comprehension: States and demonstrates understanding, Additional Education required    HOME EXERCISE PROGRAM: Access Code: [email or web address] URL: [email or web address] Date: 07/07/2023 Prepared by: Fannie Knee   GOALS: Goals reviewed with patient? Yes   SHORT TERM GOALS: (STG required if POC>30 days) Target Date: 07/22/2023  Pt will obtain protective, custom orthotic. Goal status: TBD/PRN  2.  Pt will demo/state understanding of initial HEP to improve pain levels and prerequisite motion. Goal status: INITIAL   LONG TERM GOALS: Target Date: 09/16/2023  Pt will improve functional ability by decreased impairment per Quick DASH assessment from 34% to 15% or better, for better quality of life. Goal status: INITIAL  2.  Pt will improve lateral pinch strength in left hand from 5 pounds to at least 10 pounds for functional use at home and in IADLs. Goal status: INITIAL  3.  Pt will improve A/ROM in left wrist extension from 65 degrees to at least 72 degrees, to have functional motion for tasks like reach and grasp.  Goal status: INITIAL  4.  Pt will improve strength in left hand interossei from 3 -/5 MMT to at least 4/5 MMT to have increased functional ability with left hand. Goal status: INITIAL  5.  Pt will improve coordination skills in  left hand, as seen by significantly improved score on modified nine-hole peg testing to have increased functional ability to carry out fine motor tasks (fasteners, etc.) and more complex,  coordinated IADLs (meal prep, sports, etc.).  Goal status: INITIAL  6.  Pt will decrease tingling at rest from 6/10 to 3/10 or better to have better sleep and occupational participation in daily roles. Goal status: INITIAL    ASSESSMENT:  CLINICAL IMPRESSION: 07/27/23: She states that she has not been focusing on proximal shoulder mobility and it shows so she is just a stiff.  I think she has been focusing on is hand strength and nerve gliding, and these things have significantly improved fortunately.  She was given upgrades to her home exercises today and will be seen again for another check of progress and possible discharge in the next 2 to 3 weeks if all things continue to improve.    PLAN:  OT FREQUENCY: 1-2x/week  OT DURATION: 10 weeks up to 09/16/2023 and 10 total visits as needed (anticipate taking weeks off for her personal management and returning for status checks)  PLANNED INTERVENTIONS: 97168 OT Re-evaluation, 97535 self care/ADL training, 96045 therapeutic exercise, 97530 therapeutic activity, 97112 neuromuscular re-education, 97140 manual therapy, 97035 ultrasound, 97010 moist heat, 97010 cryotherapy, 97032 electrical stimulation (manual), 97014 electrical stimulation unattended, 97760 Orthotics management and training, 40981 Splinting (initial encounter), [id number] Subsequent splinting/medication, scar mobilization, compression bandaging, Dry needling, coping strategies training, and patient/family education  CONSULTED AND AGREED WITH PLAN OF CARE: Patient  PLAN FOR NEXT SESSION:   See back in 2 to 3 weeks for check of progress and possible discharge if all things continue to improve   Fannie Knee, OTR/L, CHT 07/27/2023, 9:28 AM

## 2023-07-27 ENCOUNTER — Ambulatory Visit (INDEPENDENT_AMBULATORY_CARE_PROVIDER_SITE_OTHER): Payer: Managed Care, Other (non HMO) | Admitting: Rehabilitative and Restorative Service Providers"

## 2023-07-27 ENCOUNTER — Encounter: Payer: Self-pay | Admitting: Rehabilitative and Restorative Service Providers"

## 2023-07-27 DIAGNOSIS — M6281 Muscle weakness (generalized): Secondary | ICD-10-CM | POA: Diagnosis not present

## 2023-07-27 DIAGNOSIS — R202 Paresthesia of skin: Secondary | ICD-10-CM

## 2023-07-27 DIAGNOSIS — R278 Other lack of coordination: Secondary | ICD-10-CM

## 2023-08-03 ENCOUNTER — Encounter: Payer: Managed Care, Other (non HMO) | Admitting: Rehabilitative and Restorative Service Providers"

## 2023-08-12 NOTE — Therapy (Signed)
 OUTPATIENT OCCUPATIONAL THERAPY TREATMENT AND DISCHARGE NOTE  Patient Name: [name] MRN: [id number] DOB:1986-04-07, 38 y.o., female Today's Date: 08/15/2023  PCP: Scifres, Peter Minium REFERRING PROVIDER: Alfonse Alpers, PA-C          OCCUPATIONAL THERAPY DISCHARGE SUMMARY  Visits from Start of Care: 3  Current functional level related to goals / functional outcomes: 08/15/23: She now has met most of her long-term goals, her static 2-point discrimination test is greatly improved and now is in normal 3 mm in the left hand both median and ulnar nerve distributions.  Her coordination is greatly improved and her gripping and pinching is somewhat better but not as good as OT had hoped for now.  We discussed that if she continues this plan of care and continues to avoid things that would increase nerve impingement, she should only continue to improve.  Successfully discharged today    Education / Equipment: Pt has all needed materials and education. Pt understands how to continue on with self-management. See tx notes for more details.   Patient agrees to discharge due to max benefits received from outpatient occupational therapy / hand therapy at this time.   Fannie Knee, OTR/L, CHT 09/11/2023   END OF SESSION:  OT End of Session - 08/15/23 0800     Visit Number 3    Number of Visits 10    Date for OT Re-Evaluation 09/16/23    Authorization Type [employer]    OT Start Time 0800    OT Stop Time 0840    OT Time Calculation (min) 40 min    Activity Tolerance Patient tolerated treatment well;Patient limited by fatigue;No increased pain    Behavior During Therapy WFL for tasks assessed/performed               Past Medical History:  Diagnosis Date   GERD (gastroesophageal reflux disease)    occasional , no meds    History of 2019 novel coronavirus disease (COVID-19)    tested positive 04-12-2019 (results in care everywhere);  (10-04-2019 per pt symptoms where  aches, head cold, fatigue, loss taste/ smell;  states symptoms resolved in 2 weeks)   History of thyroid cancer (10-04-2019 per pt lov 07/ 2020, no recurrence   endocrinologist--- Dr Maisie Fus in [place]  (dx 01/ 2010)  s/p  thyroidectomy left 01/ 2010 and right 02/ 2010;  post RAI 03/ 2010     Hypertension    followed by pcp   (10-04-2019 per pt never had a stress test)   Hypothyroidism, postsurgical endocrinologist--- Dr Maisie Fus in [place]   s/p total thyroidectomy 2010 for cancer   Infertility, female    PCOS (polycystic ovarian syndrome)    PONV (postoperative nausea and vomiting)    Uterine fibroid    Past Surgical History:  Procedure Laterality Date   CESAREAN SECTION MULTI-GESTATIONAL N/A 07/31/2020   Procedure: CESAREAN SECTION MULTI-GESTATIONAL;  Surgeon: Huel Cote, MD;  Location: [facility];  Service: Obstetrics;  Laterality: N/A;  RNFA if one by then   HYSTEROSCOPY WITH RESECTOSCOPE N/A 10/10/2019   Procedure: HYSTEROSCOPY, BIPOLAR RESECTION of SUBMUCOSAL MYOMAS;  Surgeon: Fermin Schwab, MD;  Location: [facility];  Service: Gynecology;  Laterality: N/A;   LAPAROSCOPIC GELPORT ASSISTED MYOMECTOMY  05-01-2019   @[facility]   dr Lenell Antu ADHESIONS/ MYOMECTOMY/ EXCISION AND ABLATION ENDOMETRIOSIS/ CHROMOTUBATION   OVUM / OOCYTE RETRIEVAL  03/2019   THYROID LOBECTOMY Bilateral left 01/ 2010;  right 02/ 2010   Patient Active Problem List  Diagnosis Date Noted   S/P primary low transverse C-section 07/31/2020    ONSET DATE: DOS Oct 2024   REFERRING DIAG: 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 The findings are most consistent with the following: Evidence of a subacute, patchy left brachial  plexopathy, as evidenced by active denervation changes and reduced recruitment patterns, but lack of chronic neurogenic changes at this time. An overlapping left ulnar mononeuropathy is also possible. Could consider neuromuscular ultrasound or neurologic consultation if clinically indicated. No definite electrodiagnostic evidence of a left cervical (C5-T1) motor radiculopathy. No electrodiagnostic evidence of a left or right median mononeuropathy at or distal to the wrist (ie: carpal tunnel syndrome)."  She is now ~3 months post-op.  She states having Lt arm ulnar nerve weakness after fall, Lt shoulder fx. She states she fell March 2024 in [place] and broke her Lt shoulder, had weakness, paresthesias, was dx with brachial plexus injury, then had UN transposition in Oct ~2-3 months ago. Works in [employer] for an [employer].   PRECAUTIONS: None; RED FLAGS: None   WEIGHT BEARING RESTRICTIONS: No   SUBJECTIVE:   SUBJECTIVE STATEMENT: She arrives after 2.5 weeks of self-management, states feeling a bit stronger, but still feeling numb.     PAIN:  Are you having pain? Yes: NPRS scale: no pain at rest but rates tingling/numbness 6/10 now  Pain location: Lt hand, ulnar side Pain description: tingling, numb Aggravating factors: sleep Relieving factors: raising hand   PLOF: Independent  PATIENT GOALS: To improve use of left nondominant hand and arm and prevent further atrophy    OBJECTIVE: (All objective assessments below are from initial evaluation on: 07/07/23 unless otherwise specified.)   HAND DOMINANCE: Right   ADLs: Overall ADLs: States decreased ability to grab, hold household objects, pain and difficulty to open containers, perform FMS tasks (manipulate fasteners on clothing)   FUNCTIONAL OUTCOME MEASURES: 08/15/23: Quick DASH 11% impairment today  (Higher % Score  =  More Impairment)    Eval: Quick DASH 34% impairment today  (Higher % Score  =  More Impairment)    UPPER  EXTREMITY ROM     Shoulder to Wrist AROM Right eval Left eval Lt 07/27/23 Lt 08/15/23  Shoulder flexion  142    Shoulder abduction  131 131 138  Shoulder extension      Shoulder internal rotation  37 27 41  Shoulder external rotation  69 77 78  Elbow flexion  full    Elbow extension  full    Forearm supination  84 81 88  Forearm pronation   90  90  Wrist flexion 84 75 72 81  Wrist extension 71 65 64 74  (Blank rows = not tested)   Hand AROM Right eval Left eval Lt 07/27/23  Full Fist Ability (or Gap to Distal Palmar Crease) full Full fist full  Thumb Opposition  (Kapandji Scale)  10/10 8/10 9/10  (Blank rows = not tested)   UPPER EXTREMITY MMT:     MMT Left Eval Lt 07/27/23 Lt 08/15/23  Finger adduction 4-/5 4-/5 4/5  Finger abduction 3-/5 3/5 3+/5  Elbow flexion     Elbow extension     Forearm supination     Forearm pronation  Wrist flexion 5/5  5/5  Wrist extension 5/5  5/5  Wrist ulnar deviation     Wrist radial deviation     (Blank rows = not tested)  HAND FUNCTION: 08/15/23: Grip strength Left: 48 lbs  Lt lat pinch: 6#  07/27/23: Grip strength Left: 53 lbs  Lt lat pinch: 8#  Eval: Observed weakness in affected Lt hand.  Grip strength Right: 66 lbs, Left: 46 lbs  Rt lat pinch: 17#; Lt lat pinch: 5#  COORDINATION: 08/15/23: Modified 9HPT: 24.7sec Lt  07/27/23: Modified 9HPT: 35.8sec Lt; 30 sec Rt  Eval: Observed coordination impairments with affected left hand.  Details will be tested next session 9 Hole Peg Test (modified for use of digits 4 and 5) Right: TBD sec, Left: TBD sec   SENSATION: 08/15/23: S2PD Test Lt SF: 3mm; Lt IF: 3 mm  07/27/23: S2PD Test Lt SF: 4mm; Lt IF: 3 mm  Eval:  Light touch is somewhat diminished in Lt hand ulnar nerve, though deep pressure is intact, temp sense intact.  Static 2pt discrimination  Rt SF 3.85mm, Rt IF 3mm;   Lt SF 6mm, Lt IF 4.5    TODAY'S TREATMENT:  08/15/23: Pt performs AROM, gripping, and strength with left  hand and arm against therapist's resistance for exercise/activities as well as new measures today. OT also discusses home and functional tasks with the pt and reviews goals.  The only long-term goals that she did not meet her strength goal and subjective paresthesia goals which will take additional time for her.  Fortunately she has done most of her therapy at home with the plan of care and a home exercise program, and she feels comfortable doing this on her own and discharging today.  Using the complied data, OT also reviews home exercises and provides final recommendations (including trying tai chi if she is interested). Pt states understanding and tolerates upgrades well.      Exercises reviewed today: - Ulnar Nerve Flossing  - 3-4 x daily - 1-2 sets - 5-10 reps - Seated Scapular Retraction  - 2-3 x daily - 3-5 reps - 3 sec hold - Sleeper Stretch  - 3-4 x daily - 5 reps - 15-20 sec hold - Doorway Pec Stretch at 90 Degrees Abduction  - 4-6 x daily - 1 sets - 10-15 reps - Standing Shoulder Abduction Slides at Wall  - 2-3 x daily - 3-5 reps - 15 sec hold - Wrist Prayer Stretch  - 4 x daily - 3-5 reps - 15 sec hold - Full Fist  - 2-3 x daily - 5 reps - "Duck Mouth" Strength  - 2-3 x daily - 5 reps - Finger Key Grip with Putty  - 2-3 x daily - 5 reps - Cutting Putty  - 2-3 x daily - 5 reps - Finger Spread  - 2-3 x daily - 5 reps - Standing neck/upper traps stretch  - 4-6 x daily - 3-5 reps - 15 sec hold - Fridge Door Stretch  - 4 x daily - 3-5 reps - 15 sec hold - Finger Key Grip with Putty  - 2-3 x daily - 5 reps    PATIENT EDUCATION: Education details: See tx section above for details  Person educated: Patient Education method: Verbal Instruction, Teach back, Handouts  Education comprehension: States and demonstrates understanding,   HOME EXERCISE PROGRAM: Access Code: [email or web address] URL: [email or web address] Date: 07/07/2023 Prepared by: Fannie Knee   GOALS: Goals  reviewed with patient? Yes   SHORT TERM  GOALS: (STG required if POC>30 days) Target Date: 07/22/2023  Pt will obtain protective, custom orthotic. Goal status: D/C  2.  Pt will demo/state understanding of initial HEP to improve pain levels and prerequisite motion. Goal status: 08/14/33: MET   LONG TERM GOALS: Target Date: 09/16/2023  Pt will improve functional ability by decreased impairment per Quick DASH assessment from 34% to 15% or better, for better quality of life. Goal status: 08/15/23: Goal MET  2.  Pt will improve lateral pinch strength in left hand from 5 pounds to at least 10 pounds for functional use at home and in IADLs. Goal status: 08/15/23: Goal NOT MET but slightly improved  3.  Pt will improve A/ROM in left wrist extension from 65 degrees to at least 72 degrees, to have functional motion for tasks like reach and grasp.  Goal status: 08/15/23: Goal MET  4.  Pt will improve strength in left hand interossei from 3 -/5 MMT to at least 4/5 MMT to have increased functional ability with left hand. Goal status: 08/15/23: Goal NOT MET but improved  5.  Pt will improve coordination skills in left hand, as seen by significantly improved score on modified nine-hole peg testing to have increased functional ability to carry out fine motor tasks (fasteners, etc.) and more complex, coordinated IADLs (meal prep, sports, etc.).  Goal status: 08/15/23: Goal MET  6.  Pt will decrease tingling at rest from 6/10 to 3/10 or better to have better sleep and occupational participation in daily roles. Goal status: 08/15/23: Goal NOT MET, but objective testing shows excellent sensation     ASSESSMENT:  CLINICAL IMPRESSION: 08/15/23: She now has met most of her long-term goals, her static 2-point discrimination test is greatly improved and now is in normal 3 mm in the left hand both median and ulnar nerve distributions.  Her coordination is greatly improved and her gripping and pinching is somewhat better but  not as good as OT had hoped for now.  We discussed that if she continues this plan of care and continues to avoid things that would increase nerve impingement, she should only continue to improve.  Successfully discharged today    PLAN:  OT FREQUENCY: DC  OT DURATION: D/C  PLANNED INTERVENTIONS: 97168 OT Re-evaluation, 97535 self care/ADL training, 16109 therapeutic exercise, 97530 therapeutic activity, 97112 neuromuscular re-education, 97140 manual therapy, 97035 ultrasound, 97010 moist heat, 97010 cryotherapy, 97032 electrical stimulation (manual), 97014 electrical stimulation unattended, 97760 Orthotics management and training, 60454 Splinting (initial encounter), [id number] Subsequent splinting/medication, scar mobilization, compression bandaging, Dry needling, coping strategies training, and patient/family education  CONSULTED AND AGREED WITH PLAN OF CARE: Patient  PLAN FOR NEXT SESSION:   D/C   Fannie Knee, OTR/L, CHT 08/15/2023, 8:45 AM

## 2023-08-15 ENCOUNTER — Ambulatory Visit (INDEPENDENT_AMBULATORY_CARE_PROVIDER_SITE_OTHER): Payer: Managed Care, Other (non HMO) | Admitting: Rehabilitative and Restorative Service Providers"

## 2023-08-15 ENCOUNTER — Encounter: Payer: Self-pay | Admitting: Rehabilitative and Restorative Service Providers"

## 2023-08-15 DIAGNOSIS — M6281 Muscle weakness (generalized): Secondary | ICD-10-CM | POA: Diagnosis not present

## 2023-08-15 DIAGNOSIS — R278 Other lack of coordination: Secondary | ICD-10-CM

## 2023-08-15 DIAGNOSIS — R202 Paresthesia of skin: Secondary | ICD-10-CM

## 2023-12-27 ENCOUNTER — Other Ambulatory Visit: Payer: Self-pay | Admitting: Obstetrics and Gynecology

## 2023-12-27 DIAGNOSIS — R928 Other abnormal and inconclusive findings on diagnostic imaging of breast: Secondary | ICD-10-CM

## 2024-01-05 ENCOUNTER — Inpatient Hospital Stay
Admission: RE | Admit: 2024-01-05 | Discharge: 2024-01-05 | Discharge status: Home / Self Care | Source: Ambulatory Visit | Attending: Obstetrics and Gynecology | Admitting: Obstetrics and Gynecology

## 2024-01-05 ENCOUNTER — Ambulatory Visit
Admission: RE | Admit: 2024-01-05 | Discharge: 2024-01-05 | Disposition: A | Discharge status: Home / Self Care | Source: Ambulatory Visit | Attending: Obstetrics and Gynecology | Admitting: Obstetrics and Gynecology

## 2024-01-05 DIAGNOSIS — R928 Other abnormal and inconclusive findings on diagnostic imaging of breast: Secondary | ICD-10-CM
